# Patient Record
Sex: Female | Born: 1985 | Race: White | Hispanic: No | Marital: Single | Smoking: Former smoker
Health system: Southern US, Community
[De-identification: ages and names within clinical notes are randomized; demographics above are authoritative.]

## PROBLEM LIST (undated history)

## (undated) ENCOUNTER — Inpatient Hospital Stay: Admission: EM | Payer: Self-pay | Source: Home / Self Care

## (undated) DIAGNOSIS — E559 Vitamin D deficiency, unspecified: Secondary | ICD-10-CM

## (undated) DIAGNOSIS — R112 Nausea with vomiting, unspecified: Secondary | ICD-10-CM

## (undated) DIAGNOSIS — G4730 Sleep apnea, unspecified: Secondary | ICD-10-CM

## (undated) DIAGNOSIS — Z9889 Other specified postprocedural states: Secondary | ICD-10-CM

## (undated) DIAGNOSIS — G8929 Other chronic pain: Secondary | ICD-10-CM

## (undated) DIAGNOSIS — F32A Depression, unspecified: Secondary | ICD-10-CM

## (undated) DIAGNOSIS — Z6841 Body Mass Index (BMI) 40.0 and over, adult: Secondary | ICD-10-CM

## (undated) DIAGNOSIS — K219 Gastro-esophageal reflux disease without esophagitis: Secondary | ICD-10-CM

## (undated) DIAGNOSIS — I499 Cardiac arrhythmia, unspecified: Secondary | ICD-10-CM

## (undated) DIAGNOSIS — F319 Bipolar disorder, unspecified: Secondary | ICD-10-CM

## (undated) DIAGNOSIS — F419 Anxiety disorder, unspecified: Secondary | ICD-10-CM

## (undated) DIAGNOSIS — I10 Essential (primary) hypertension: Secondary | ICD-10-CM

## (undated) DIAGNOSIS — J45909 Unspecified asthma, uncomplicated: Secondary | ICD-10-CM

## (undated) DIAGNOSIS — E538 Deficiency of other specified B group vitamins: Secondary | ICD-10-CM

## (undated) DIAGNOSIS — G43909 Migraine, unspecified, not intractable, without status migrainosus: Secondary | ICD-10-CM

## (undated) HISTORY — PX: MOUTH SURGERY: SHX715

## (undated) HISTORY — PX: WISDOM TOOTH EXTRACTION: SHX21

## (undated) HISTORY — PX: CHOLECYSTECTOMY: SHX55

## (undated) HISTORY — PX: OTHER SURGICAL HISTORY: SHX169

---

## 2009-02-06 ENCOUNTER — Emergency Department: Payer: Self-pay | Admitting: Emergency Medicine

## 2009-02-12 HISTORY — PX: CHOLECYSTECTOMY: SHX55

## 2012-02-13 HISTORY — PX: OTHER SURGICAL HISTORY: SHX169

## 2012-10-23 ENCOUNTER — Ambulatory Visit: Payer: Self-pay | Admitting: Obstetrics and Gynecology

## 2012-10-23 LAB — BASIC METABOLIC PANEL
BUN: 11 mg/dL (ref 7–18)
Calcium, Total: 8.9 mg/dL (ref 8.5–10.1)
Chloride: 107 mmol/L (ref 98–107)
Co2: 24 mmol/L (ref 21–32)
Creatinine: 0.73 mg/dL (ref 0.60–1.30)
EGFR (African American): >60
Glucose: 81 mg/dL (ref 65–99)
Potassium: 4.3 mmol/L (ref 3.5–5.1)

## 2012-10-23 LAB — CBC
HCT: 38 % (ref 35.0–47.0)
HGB: 13 g/dL (ref 12.0–16.0)
MCH: 29.8 pg (ref 26.0–34.0)
MCV: 87 fL (ref 80–100)
Platelet: 230 10*3/uL (ref 150–440)
RBC: 4.37 10*6/uL (ref 3.80–5.20)

## 2012-10-28 ENCOUNTER — Ambulatory Visit: Payer: Self-pay | Admitting: Obstetrics and Gynecology

## 2012-11-28 ENCOUNTER — Emergency Department: Payer: Self-pay | Admitting: Emergency Medicine

## 2013-02-27 ENCOUNTER — Emergency Department: Payer: Self-pay | Admitting: Emergency Medicine

## 2013-02-27 LAB — CBC
HCT: 35.4 % (ref 35.0–47.0)
HGB: 12.3 g/dL (ref 12.0–16.0)
MCH: 30.8 pg (ref 26.0–34.0)
MCHC: 34.8 g/dL (ref 32.0–36.0)
MCV: 88 fL (ref 80–100)
PLATELETS: 202 10*3/uL (ref 150–440)
RBC: 4.01 10*6/uL (ref 3.80–5.20)
RDW: 14 % (ref 11.5–14.5)
WBC: 9.5 10*3/uL (ref 3.6–11.0)

## 2013-02-27 LAB — URINALYSIS, COMPLETE
Bilirubin,UR: NEGATIVE
Glucose,UR: NEGATIVE mg/dL (ref 0–75)
KETONE: NEGATIVE
Nitrite: NEGATIVE
Ph: 6 (ref 4.5–8.0)
Protein: NEGATIVE
RBC,UR: 15 /HPF (ref 0–5)
Specific Gravity: 1.005 (ref 1.003–1.030)
Squamous Epithelial: 1
WBC UR: 2 /HPF (ref 0–5)

## 2013-02-27 LAB — HCG, QUANTITATIVE, PREGNANCY: Beta Hcg, Quant.: 76698 m[IU]/mL — ABNORMAL HIGH

## 2013-03-26 ENCOUNTER — Encounter: Payer: Self-pay | Admitting: Maternal & Fetal Medicine

## 2013-05-07 ENCOUNTER — Encounter: Payer: Self-pay | Admitting: Maternal & Fetal Medicine

## 2013-05-21 ENCOUNTER — Observation Stay: Payer: Self-pay

## 2013-05-21 LAB — URINALYSIS, COMPLETE
BILIRUBIN, UR: NEGATIVE
Bacteria: NONE SEEN
Blood: NEGATIVE
Glucose,UR: NEGATIVE mg/dL (ref 0–75)
KETONE: NEGATIVE
Leukocyte Esterase: NEGATIVE
NITRITE: NEGATIVE
PROTEIN: NEGATIVE
Ph: 5 (ref 4.5–8.0)
RBC,UR: NONE SEEN /HPF (ref 0–5)
SPECIFIC GRAVITY: 1.013 (ref 1.003–1.030)
WBC UR: <1 /HPF (ref 0–5)

## 2013-06-23 ENCOUNTER — Observation Stay: Payer: Self-pay | Admitting: Obstetrics & Gynecology

## 2013-06-23 LAB — URINALYSIS, COMPLETE
Bilirubin,UR: NEGATIVE
Blood: NEGATIVE
GLUCOSE, UR: NEGATIVE mg/dL (ref 0–75)
Ketone: NEGATIVE
LEUKOCYTE ESTERASE: NEGATIVE
NITRITE: NEGATIVE
PROTEIN: NEGATIVE
Ph: 7 (ref 4.5–8.0)
RBC,UR: <1 /HPF (ref 0–5)
Specific Gravity: 1.006 (ref 1.003–1.030)
Squamous Epithelial: 2
WBC UR: NONE SEEN /HPF (ref 0–5)

## 2013-06-23 LAB — FETAL FIBRONECTIN
Appearance: NORMAL
Fetal Fibronectin: NEGATIVE

## 2013-07-02 ENCOUNTER — Encounter: Payer: Self-pay | Admitting: Maternal & Fetal Medicine

## 2013-08-13 ENCOUNTER — Encounter: Payer: Self-pay | Admitting: Obstetrics and Gynecology

## 2013-08-20 DIAGNOSIS — F3181 Bipolar II disorder: Secondary | ICD-10-CM | POA: Insufficient documentation

## 2013-08-21 ENCOUNTER — Observation Stay: Payer: Self-pay | Admitting: Obstetrics & Gynecology

## 2013-09-10 ENCOUNTER — Encounter: Payer: Self-pay | Admitting: Obstetrics & Gynecology

## 2013-10-02 ENCOUNTER — Inpatient Hospital Stay: Payer: Self-pay

## 2013-10-02 LAB — PIH PROFILE
Anion Gap: 12 (ref 7–16)
BUN: 4 mg/dL — AB (ref 7–18)
CHLORIDE: 108 mmol/L — AB (ref 98–107)
Calcium, Total: 8.8 mg/dL (ref 8.5–10.1)
Co2: 22 mmol/L (ref 21–32)
Creatinine: 0.63 mg/dL (ref 0.60–1.30)
EGFR (African American): >60
EGFR (Non-African Amer.): >60
GLUCOSE: 93 mg/dL (ref 65–99)
HCT: 32.4 % — ABNORMAL LOW (ref 35.0–47.0)
HGB: 10.8 g/dL — ABNORMAL LOW (ref 12.0–16.0)
MCH: 29.9 pg (ref 26.0–34.0)
MCHC: 33.3 g/dL (ref 32.0–36.0)
MCV: 90 fL (ref 80–100)
OSMOLALITY: 280 (ref 275–301)
PLATELETS: 169 10*3/uL (ref 150–440)
POTASSIUM: 3.6 mmol/L (ref 3.5–5.1)
RBC: 3.61 10*6/uL — ABNORMAL LOW (ref 3.80–5.20)
RDW: 15.2 % — ABNORMAL HIGH (ref 11.5–14.5)
SGOT(AST): 10 U/L — ABNORMAL LOW (ref 15–37)
SODIUM: 142 mmol/L (ref 136–145)
Uric Acid: 4.6 mg/dL (ref 2.6–6.0)
WBC: 10.4 10*3/uL (ref 3.6–11.0)

## 2013-10-02 LAB — PROTEIN / CREATININE RATIO, URINE
Creatinine, Urine: 115.2 mg/dL (ref 30.0–125.0)
PROTEIN/CREAT. RATIO: 174 mg/g{creat} (ref 0–200)
Protein, Random Urine: 20 mg/dL — ABNORMAL HIGH (ref 0–12)

## 2013-10-04 LAB — CBC WITH DIFFERENTIAL/PLATELET
BASOS ABS: 0 10*3/uL (ref 0.0–0.1)
BASOS PCT: 0.3 %
Eosinophil #: 0.1 10*3/uL (ref 0.0–0.7)
Eosinophil %: 1 %
HCT: 32.3 % — ABNORMAL LOW (ref 35.0–47.0)
HGB: 11.1 g/dL — AB (ref 12.0–16.0)
Lymphocyte #: 2.1 10*3/uL (ref 1.0–3.6)
Lymphocyte %: 24.4 %
MCH: 30.8 pg (ref 26.0–34.0)
MCHC: 34.4 g/dL (ref 32.0–36.0)
MCV: 89 fL (ref 80–100)
MONO ABS: 0.5 x10 3/mm (ref 0.2–0.9)
Monocyte %: 5.5 %
NEUTROS PCT: 68.8 %
Neutrophil #: 6 10*3/uL (ref 1.4–6.5)
Platelet: 165 10*3/uL (ref 150–440)
RBC: 3.61 10*6/uL — AB (ref 3.80–5.20)
RDW: 15.5 % — ABNORMAL HIGH (ref 11.5–14.5)
WBC: 8.7 10*3/uL (ref 3.6–11.0)

## 2014-06-04 NOTE — Op Note (Signed)
PATIENT NAME:  Melinda Mendoza, Melinda Mendoza MR#:  [id number] DATE OF BIRTH:  01-03-1986  DATE OF PROCEDURE:  10/28/2012  PREOPERATIVE DIAGNOSIS: Retained intrauterine device.   POSTOPERATIVE DIAGNOSIS: Retained intrauterine device.   PROCEDURES: 1.  Exam under anesthesia.  2.  Removal of intrauterine device.  SURGEON: Thomasene MohairStephen Jackson, MD   ANESTHESIA: General.   ESTIMATED BLOOD LOSS: Minimal.   OPERATIVE FLUIDS: 300 mL crystalloid.   COMPLICATIONS: None.   FINDINGS: 1.  Normal pelvic exam.  2.  Complete and intact removal of intrauterine device.   SPECIMENS: None.   CONDITION AT END OF PROCEDURE: Stable.   INDICATIONS FOR PROCEDURE: Marchelle Folksmanda is a 29 year old female who had an in-office attempted removal of her intrauterine device that was unsuccessful. [facility] where Dr. Luella Cookosenow attempted and in-office removal where the string broke from the IUD. She was therefore referred to me for removal under anesthesia.   PROCEDURE IN DETAIL: The patient was taken to the operating room where general anesthesia was administered and found to be adequate. She was positioned in the dorsal supine, high lithotomy position and prepped and draped in the usual sterile fashion. After timeout was called, a red rubber catheter was introduced into her bladder for straight catheterization of approximately 100 mL of clear urine. A sterile speculum was placed in the vagina, and the anterior lip of the cervix was grasped with a single-tooth tenaculum. Packing forceps was introduced gently through the cervix, and on the third pass the IUD was removed from the lower uterine segment intact. This terminated the procedure. The single-tooth tenaculum was removed from the anterior lip of the cervix and hemostasis was obtained using silver nitrate. The speculum was then removed from the vagina and the vagina was ensured to have no instrumentation or sponges remaining.   The patient tolerated the procedure well.  Sponge, lap and needle counts were correct x 2. The patient was awakened in the operating room and taken to the recovery area in stable condition.  ____________________________ Conard NovakStephen D. Jackson, MD sdj:sb D: 10/28/2012 10:17:20 ET T: 10/28/2012 10:43:51 ET JOB#: [id number]  cc: Conard NovakStephen D. Jackson, MD, <Dictator> Conard NovakSTEPHEN D JACKSON MD ELECTRONICALLY SIGNED 12/02/2012 15:07

## 2014-06-22 NOTE — H&P (Signed)
L&D Evaluation:  History:  HPI 29 yo G2P1001 a [redacted]w[redacted]d at 325w1dy 8wk ultrasound presents today after routine prental visit at [facility] noted BP with elevated systolic into the 14470'Rno proteinuria.  Denies headaches, vision changes, RUQ or epigastric pain, no increased edema, no LOF, no VB, no ctx, +FM.   PNC at [facility] remarkable for scoliosis, obesity, a UTI in January, a hx of bipolar/depression, and first trimester bleeding.  Past hx of pyelo. No hx of kidney stones. B POS, RNI, VNI. SVD 2010 at term   Presents with abdominal pain, back pain, nausea/vomiting   Patient's Medical History 1) heart burn, 2) history of depression/bipolar disorder   Patient's Surgical History cholecystectomy 2010   Medications Pre Natal Vitamins  ferrous sulfate 32574mab po bid, lamictal 100m39mb po daily   Allergies NKDA   Social History none  Former smoker   Family History Non-Contributory   Exam:  Vital Signs BP >140/90  135/63; 141; 75; 135/72; 140/74; 130/72; 134/7; 130/67   Urine Protein P/C ratio pending   General no apparent distress   Mental Status clear   Chest clear   Heart normal sinus rhythm   Abdomen gravid, non-tender   Estimated Fetal Weight Average for gestational age   Fetal Position vtx   Back no CVAT   Edema trace BLE edema   Pelvic 1cm in clinic today   Mebranes Intact   FHT 130, moderate, positive accels, no decels   Ucx absent   Skin dry, no lesions   Impression:  Impression 28 y18G2P1001 at 39w169w1d elevated BP's   Plan:  Comments 1) Elevated BP - given at term >39 weeks discussed pros and cons of delivery vs expectant mangement.  I have no concerns regarding status of the fetus currently or should we deliver at this gestational rage.  With expectant mangement patient risk worening of blood pressures, possible fetal compromise and after discussion of recommendation laid forth by ACOG taskforce on hypertension in pregnancy - PIH panel without  abnormalities P/C ratio 174 (prior 09/17/13 101) - cervidil tonight - no antihypertensive or magnesium sulfate unless patient develops severe range BP's  2) Fetus - category I tracing/reactive - 1-hr 125 (early), 133 repeat at 28 weeks - 19lbs weight gain this pregnancy - tested to 6lbs 13oz with G1  3) PNL B pos / ABsC neg / RNI / VZNI / negative 1st trimester screen / HIV neg / RPR NR / HBsAg neg / 1-hr 125 (early) 133 on repeat / GBS positive - Ampicillin for GBS ppx - Varivax & MMR at discharge  4) Anemia - Hgb 10.1 at 28 weeks, 10.8 today  5) Bipolar/depression - increased risk of postpartum depression (including postpartum depression with G1) - continue lamictal (started 7/19 pre [facility] records)   6) TDAP - received 07/13/13  7) Disposition - pending delivery   Electronic Signatures: StaebDorthula Nettles  (Signed 21-Aug-15 19:55)  Authored: L&D Evaluation   Last Updated: 21-Aug-15 19:55 by StaebDorthula Nettles

## 2014-06-22 NOTE — H&P (Signed)
L&D Evaluation:  History Expanded:  HPI 29 yo G2P1001 at 8336w0d by 7wk ultrasound.  Presents with new-onset RUQ pain since this AM.  Described as sharp, stabbing. Does not radiate.  no nausea, emesis, fevers, chills. not affected by eating.  No diarrhea, mild constipation but baseline for her.  Notes positive fetal movement, no leakage of fluid , no vaginal bleeding, no contractions.  Notes occasional urinary leakage but no urinary symptoms.  No vaginal symptoms.  She has had heartburn.  She has had her gallbladder removed.   Patient's Medical History 1) heart burn, 2) history of depression   Patient's Surgical History cholecystectomy   Medications none   Allergies NKDA   Social History none   Family History Non-Contributory   Exam:  Vital Signs afebrile, normotensive, all others within normal limits   General no apparent distress   Mental Status clear   Chest clear   Heart normal sinus rhythm   Abdomen mildly tender to palpation in RUQ, no rebound or guarding.  No uterine tenerness, uterus is soft.   Fundal Height at umbilicus   Back no CVAT   Edema no edema   FHT normal given gestational age   Ucx absent   Skin no lesions   Impression:  Impression 1) Intrauterine pregnancy at 20 weeks, 2) RUQ pain likely either MSK or GERD related   Plan:  Plan UA   Comments RUQ pain. reassured patient.  Will give rx for flexeril for MSK issue.  Recommend zantac for gerd.  return to clinic next week if not improved. return to hospital if worsens.   Electronic Signatures: Conard NovakJackson, Loic Hobin D (MD)  (Signed 09-Apr-15 14:36)  Authored: L&D Evaluation   Last Updated: 09-Apr-15 14:36 by Conard NovakJackson, Jerica Creegan D (MD)

## 2014-06-22 NOTE — H&P (Signed)
L&D Evaluation:  History Expanded:  HPI 29 yo G2P1 at 33 weeks w contractions and lower abd pain, good FM and no vaginal bleeding or ROM.  Prenatal Care at [facility].   Presents with abdominal pain, contractions   Patient's Medical History No Chronic Illness   Patient's Surgical History none   Medications Pre Natal Vitamins   Allergies NKDA   Social History none   Family History Non-Contributory   ROS:  ROS All systems were reviewed.  HEENT, CNS, GI, GU, Respiratory, CV, Renal and Musculoskeletal systems were found to be normal.   Exam:  Vital Signs stable   General no apparent distress   Mental Status clear   Chest clear   Heart normal sinus rhythm   Abdomen gravid, tender with contractions   Estimated Fetal Weight Average for gestational age   Back no CVAT   Edema no edema   Pelvic no external lesions, cervix closed and thick   Mebranes Intact   FHT normal rate with no decels   Ucx irregular   Impression:  Impression False labor, Pain.   Plan:  Plan EFM/NST, monitor contractions and for cervical change, fluids   Comments Note for work   Electronic Signatures: Letitia LibraHarris, Makala Fetterolf Paul (MD)  (Signed 10-Jul-15 14:35)  Authored: L&D Evaluation   Last Updated: 10-Jul-15 14:35 by Letitia LibraHarris, Lendell Gallick Paul (MD)

## 2014-06-22 NOTE — H&P (Signed)
L&D Evaluation:  History:  HPI 29 yo G2P1001 at 557w5d by 8wk ultrasound.  Presents with c/o constant lower back pain, nausea/vomiting (vomited once yesterday), loose stool x1, and lower abdominal cramping since yesterday AM. Lower back pain in the sacral area radiating to the hips. Denies fever, VB, unusual vaginal discharge, dysuria. PNC at [facility] remarkable for scoliosis, obesity, a UTI in January, a hx of bipolar/depression, and first trimester bleeding.  Past hx of pyelo. No hx of kidney stones. B POS, RNI, VNI. SVD 2010 at term   Presents with abdominal pain, back pain, nausea/vomiting   Patient's Medical History 1) heart burn, 2) history of depression/bipolar disorder   Patient's Surgical History cholecystectomy 2010   Medications Pre Natal Vitamins   Allergies NKDA   Social History none  Former smoker   Family History Non-Contributory   ROS:  ROS see HPI   Exam:  Vital Signs 116/70   Urine Protein pending   General no apparent distress   Mental Status clear   Chest clear   Heart normal sinus rhythm, no murmur/gallop/rubs   Abdomen FH at U+3. softNDNT upper abdomen with normal BS   Back no CVAT, tenderness in SI jts   Pelvic no external lesions, cervix closed and thick, FFN obtained   Mebranes Intact   FHT 135   Ucx absent   Impression:  Impression IUP at 24+ weeks with lower abdominal cramping and sacral back pain. R/O UTI. No evidence of PTL   Plan:  Plan EFM/NST, monitor contractions and for cervical change, UA sent. Zofran 8 mgm po for nausea. po hydration   Electronic Signatures: Trinna BalloonGutierrez, Donta Fuster L (CNM)  (Signed 12-May-15 08:20)  Authored: L&D Evaluation   Last Updated: 12-May-15 08:20 by Trinna BalloonGutierrez, Kaiyon Hynes L (CNM)

## 2014-11-08 ENCOUNTER — Ambulatory Visit
Admission: EM | Admit: 2014-11-08 | Discharge: 2014-11-08 | Disposition: A | Discharge status: Home / Self Care | Payer: Medicaid Other | Attending: Family Medicine | Admitting: Family Medicine

## 2014-11-08 DIAGNOSIS — H6982 Other specified disorders of Eustachian tube, left ear: Secondary | ICD-10-CM | POA: Diagnosis not present

## 2014-11-08 DIAGNOSIS — H6502 Acute serous otitis media, left ear: Secondary | ICD-10-CM

## 2014-11-08 DIAGNOSIS — H9202 Otalgia, left ear: Secondary | ICD-10-CM

## 2014-11-08 MED ORDER — FEXOFENADINE-PSEUDOEPHED ER 180-240 MG PO TB24: 1.0000 | ORAL_TABLET | Freq: Every day | ORAL | Status: DC

## 2014-11-08 MED ORDER — AMOXICILLIN-POT CLAVULANATE 875-125 MG PO TABS
1.0000 | ORAL_TABLET | Freq: Two times a day (BID) | ORAL | Status: DC
Start: 2014-11-08 — End: 2017-05-09

## 2014-11-08 MED ORDER — MOMETASONE FUROATE 50 MCG/ACT NA SUSP: 2.0000 | Freq: Every day | NASAL | Status: DC

## 2014-11-08 NOTE — ED Notes (Signed)
Started Saturday with left ear pain that was intermittent. Constant pain since last night. "Hurts deep down behind my ear". States had deep dental cleaning Friday and 3 numbing injections upper back left

## 2014-11-08 NOTE — ED Provider Notes (Signed)
CSN: [id number]     Arrival date & time 11/08/14  0807 History   First MD Initiated Contact with Patient 11/08/14 [phone]     Chief Complaint  Patient presents with  . Otalgia   patient states is going on since Friday. (Consider location/radiation/quality/duration/timing/severity/associated sxs/prior Treatment) Patient is a 29 y.o. female presenting with ear pain. The history is provided by the patient.  Otalgia Location:  Left Behind ear:  Swelling Quality:  Pressure Severity:  Moderate Duration:  3 days Timing:  Constant Progression:  Worsening Chronicity:  New Relieved by:  Nothing Worsened by:  Swallowing Ineffective treatments:  None tried Associated symptoms: congestion, rhinorrhea and sore throat   Associated symptoms: no abdominal pain, no cough, no ear discharge, no fever and no hearing loss    Patient does not smoke. Nausea exposing smokeless around her. Nurse's notes reviewed and evaluated. History reviewed. No pertinent past medical history. History reviewed. No pertinent past surgical history. History reviewed. No pertinent family history. Social History  Substance Use Topics  . Smoking status: None  . Smokeless tobacco: None  . Alcohol Use: None   OB History    No data available     Review of Systems  Constitutional: Negative for fever.  HENT: Positive for congestion, ear pain, rhinorrhea and sore throat. Negative for ear discharge and hearing loss.   Respiratory: Negative for cough.   Gastrointestinal: Negative for abdominal pain.  All other systems reviewed and are negative.   Allergies  Review of patient's allergies indicates no known allergies.  Home Medications   Prior to Admission medications   Medication Sig Start Date End Date Taking? Authorizing Provider  etonogestrel (NEXPLANON) 68 MG IMPL implant 1 each by Subdermal route once.   Yes Historical Provider, MD  amoxicillin-clavulanate (AUGMENTIN) 875-125 MG per tablet Take 1 tablet by mouth 2  (two) times daily. 11/08/14   Hassan Rowan, MD  fexofenadine-pseudoephedrine (ALLEGRA-D ALLERGY & CONGESTION) 180-240 MG per 24 hr tablet Take 1 tablet by mouth daily. 11/08/14   Hassan Rowan, MD  mometasone (NASONEX) 50 MCG/ACT nasal spray Place 2 sprays into the nose daily. 11/08/14   Hassan Rowan, MD   Meds Ordered and Administered this Visit  Medications - No data to display  BP 120/79 mmHg  Pulse 74  Temp(Src) 97.8 F (36.6 C) (Tympanic)  Resp 17  Ht  (1.676 m)  Wt 250 lb (113.399 kg)  BMI 40.37 kg/m2  SpO2 100% No data found.   Physical Exam  Constitutional: She is oriented to person, place, and time. She appears well-developed and well-nourished.  Obese white female  HENT:  Head: Normocephalic and atraumatic.  Right Ear: Hearing, tympanic membrane, external ear and ear canal normal.  Left Ear: External ear normal. Tympanic membrane is bulging. A middle ear effusion is present.  Nose: Mucosal edema and rhinorrhea present. Right sinus exhibits no maxillary sinus tenderness. Left sinus exhibits no maxillary sinus tenderness and no frontal sinus tenderness.  Mouth/Throat: No oral lesions. Normal dentition. No uvula swelling or dental caries. Posterior oropharyngeal erythema present.  Eyes: Conjunctivae and EOM are normal. Pupils are equal, round, and reactive to light.  Neck: Normal range of motion. Neck supple. No tracheal deviation present. No thyromegaly present.  Musculoskeletal: Normal range of motion.  Lymphadenopathy:    She has cervical adenopathy.  Neurological: She is alert and oriented to person, place, and time.  Skin: Skin is warm and dry. No erythema.  Psychiatric: She has a normal mood and affect.  Vitals reviewed.   ED Course  Procedures (including critical care time)  Labs Review Labs Reviewed - No data to display  Imaging Review No results found.   Visual Acuity Review  Right Eye Distance:   Left Eye Distance:   Bilateral Distance:    Right  Eye Near:   Left Eye Near:    Bilateral Near:         MDM   1. Eustachian tube dysfunction, left   2. Acute serous otitis media of left ear, recurrence not specified   3. Otalgia of left ear     Follow-up PCP if not better in a week. We'll place on Allegra-D and Nasonex nasal spray and Augmentin. Explained she needs take Augmentin for least 10 days. Patient declined work note.   Hassan Rowan, MD 11/08/14 [phone]

## 2014-11-08 NOTE — Discharge Instructions (Signed)
Otalgia Otalgia is pain in or around the ear. When the pain is from the ear itself it is called primary otalgia. Pain may also be coming from somewhere else, like the head and neck. This is called secondary otalgia.  CAUSES  Causes of primary otalgia include:  Middle ear infection.  It can also be caused by injury to the ear or infection of the ear canal (swimmer's ear). Swimmer's ear causes pain, swelling and often drainage from the ear canal. Causes of secondary otalgia include:  Sinus infections.  Allergies and colds that cause stuffiness of the nose and tubes that drain the ears (eustachian tubes).  Dental problems like cavities, gum infections or teething.  Sore Throat (tonsillitis and pharyngitis).  Swollen glands in the neck.  Infection of the bone behind the ear (mastoiditis).  TMJ discomfort (problems with the joint between your jaw and your skull).  Other problems such as nerve disorders, circulation problems, heart disease and tumors of the head and neck can also cause symptoms of ear pain. This is rare. DIAGNOSIS  Evaluation, Diagnosis and Testing:  Examination by your medical caregiver is recommended to evaluate and diagnose the cause of otalgia.  Further testing or referral to a specialist may be indicated if the cause of the ear pain is not found and the symptom persists. TREATMENT   Your doctor may prescribe antibiotics if an ear infection is diagnosed.  Pain relievers and topical analgesics may be recommended.  It is important to take all medications as prescribed. HOME CARE INSTRUCTIONS   It may be helpful to sleep with the painful ear in the up position.  A warm compress over the painful ear may provide relief.  A soft diet and avoiding gum may help while ear pain is present. SEEK IMMEDIATE MEDICAL CARE IF:  You develop severe pain, a high fever, repeated vomiting or dehydration.  You develop extreme dizziness, headache, confusion, ringing in the  ears (tinnitus) or hearing loss. Document Released: 03/08/2004 Document Revised: 04/23/2011 Document Reviewed: 12/08/2008 [facility] Patient Information 2015 [place], [place]. This information is not intended to replace advice given to you by your health care provider. Make sure you discuss any questions you have with your health care provider.  Serous Otitis Media Serous otitis media is fluid in the middle ear space. This space contains the bones for hearing and air. Air in the middle ear space helps to transmit sound.  The air gets there through the eustachian tube. This tube goes from the back of the nose (nasopharynx) to the middle ear space. It keeps the pressure in the middle ear the same as the outside world. It also helps to drain fluid from the middle ear space. CAUSES  Serous otitis media occurs when the eustachian tube gets blocked. Blockage can come from:  Ear infections.  Colds and other upper respiratory infections.  Allergies.  Irritants such as cigarette smoke.  Sudden changes in air pressure (such as descending in an airplane).  Enlarged adenoids.  A mass in the nasopharynx. During colds and upper respiratory infections, the middle ear space can become temporarily filled with fluid. This can happen after an ear infection also. Once the infection clears, the fluid will generally drain out of the ear through the eustachian tube. If it does not, then serous otitis media occurs. SIGNS AND SYMPTOMS   Hearing loss.  A feeling of fullness in the ear, without pain.  Young children may not show any symptoms but may show slight behavioral changes, such  as agitation, ear pulling, or crying. DIAGNOSIS  Serous otitis media is diagnosed by an ear exam. Tests may be done to check on the movement of the eardrum. Hearing exams may also be done. TREATMENT  The fluid most often goes away without treatment. If allergy is the cause, allergy treatment may be helpful. Fluid that persists for  several months may require minor surgery. A small tube is placed in the eardrum to:  Drain the fluid.  Restore the air in the middle ear space. In certain situations, antibiotic medicines are used to avoid surgery. Surgery may be done to remove enlarged adenoids (if this is the cause). HOME CARE INSTRUCTIONS   Keep children away from tobacco smoke.  Keep all follow-up visits as directed by your health care provider. SEEK MEDICAL CARE IF:   Your hearing is not better in 3 months.  Your hearing is worse.  You have ear pain.  You have drainage from the ear.  You have dizziness.  You have serous otitis media only in one ear or have any bleeding from your nose (epistaxis).  You notice a lump on your neck. MAKE SURE YOU:  Understand these instructions.   Will watch your condition.   Will get help right away if you are not doing well or get worse.  Document Released: 04/21/2003 Document Revised: 06/15/2013 Document Reviewed: 08/26/2012 [facility] Patient Information 2015 [place], [place]. This information is not intended to replace advice given to you by your health care provider. Make sure you discuss any questions you have with your health care provider. Barotitis Media Barotitis media is inflammation of your middle ear. This occurs when the auditory tube (eustachian tube) leading from the back of your nose (nasopharynx) to your eardrum is blocked. This blockage may result from a cold, environmental allergies, or an upper respiratory infection. Unresolved barotitis media may lead to damage or hearing loss (barotrauma), which may become permanent. HOME CARE INSTRUCTIONS   Use medicines as recommended by your health care provider. Over-the-counter medicines will help unblock the canal and can help during times of air travel.  Do not put anything into your ears to clean or unplug them. Eardrops will not be helpful.  Do not swim, dive, or fly until your health care provider says it  is all right to do so. If these activities are necessary, chewing gum with frequent, forceful swallowing may help. It is also helpful to hold your nose and gently blow to pop your ears for equalizing pressure changes. This forces air into the eustachian tube.  Only take over-the-counter or prescription medicines for pain, discomfort, or fever as directed by your health care provider.  A decongestant may be helpful in decongesting the middle ear and make pressure equalization easier. SEEK MEDICAL CARE IF:  You experience a serious form of dizziness in which you feel as if the room is spinning and you feel nauseated (vertigo).  Your symptoms only involve one ear. SEEK IMMEDIATE MEDICAL CARE IF:   You develop a severe headache, dizziness, or severe ear pain.  You have bloody or pus-like drainage from your ears.  You develop a fever.  Your problems do not improve or become worse. MAKE SURE YOU:   Understand these instructions.  Will watch your condition.  Will get help right away if you are not doing well or get worse. Document Released: 01/27/2000 Document Revised: 11/19/2012 Document Reviewed: 08/26/2012 [facility] Patient Information 2015 [place], [place]. This information is not intended to replace advice given to you  by your health care provider. Make sure you discuss any questions you have with your health care provider. ° °

## 2014-12-07 ENCOUNTER — Ambulatory Visit
Admission: EM | Admit: 2014-12-07 | Discharge: 2014-12-07 | Disposition: A | Discharge status: Home / Self Care | Payer: Medicaid Other | Attending: Family Medicine | Admitting: Family Medicine

## 2014-12-07 ENCOUNTER — Encounter: Payer: Self-pay | Admitting: Emergency Medicine

## 2014-12-07 ENCOUNTER — Ambulatory Visit: Payer: Medicaid Other

## 2014-12-07 DIAGNOSIS — S39012A Strain of muscle, fascia and tendon of lower back, initial encounter: Secondary | ICD-10-CM | POA: Diagnosis not present

## 2014-12-07 DIAGNOSIS — S4992XA Unspecified injury of left shoulder and upper arm, initial encounter: Secondary | ICD-10-CM | POA: Diagnosis not present

## 2014-12-07 DIAGNOSIS — M545 Low back pain: Secondary | ICD-10-CM | POA: Diagnosis present

## 2014-12-07 DIAGNOSIS — S29012A Strain of muscle and tendon of back wall of thorax, initial encounter: Secondary | ICD-10-CM

## 2014-12-07 MED ORDER — CYCLOBENZAPRINE HCL 10 MG PO TABS: 10.0000 mg | ORAL_TABLET | Freq: Every day | ORAL | Status: DC

## 2014-12-07 MED ORDER — IBUPROFEN 800 MG PO TABS
800.0000 mg | ORAL_TABLET | Freq: Once | ORAL | Status: AC
  Administered 2014-12-07: 800 mg via ORAL
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
## 2015-04-29 ENCOUNTER — Other Ambulatory Visit: Payer: Self-pay | Admitting: Orthopedic Surgery

## 2015-04-29 DIAGNOSIS — M5136 Other intervertebral disc degeneration, lumbar region: Secondary | ICD-10-CM

## 2015-05-20 ENCOUNTER — Ambulatory Visit
Admission: RE | Admit: 2015-05-20 | Discharge: 2015-05-20 | Disposition: A | Discharge status: Home / Self Care | Payer: Medicaid Other | Source: Ambulatory Visit | Attending: Orthopedic Surgery | Admitting: Orthopedic Surgery

## 2015-05-20 DIAGNOSIS — M5136 Other intervertebral disc degeneration, lumbar region: Secondary | ICD-10-CM | POA: Insufficient documentation

## 2015-06-07 DIAGNOSIS — F3132 Bipolar disorder, current episode depressed, moderate: Secondary | ICD-10-CM | POA: Insufficient documentation

## 2015-09-20 ENCOUNTER — Emergency Department: Payer: Medicaid Other

## 2015-09-20 ENCOUNTER — Emergency Department
Admission: EM | Admit: 2015-09-20 | Discharge: 2015-09-20 | Disposition: A | Discharge status: Home / Self Care | Payer: Medicaid Other | Attending: Emergency Medicine | Admitting: Emergency Medicine

## 2015-09-20 DIAGNOSIS — T148XXA Other injury of unspecified body region, initial encounter: Secondary | ICD-10-CM

## 2015-09-20 DIAGNOSIS — W108XXA Fall (on) (from) other stairs and steps, initial encounter: Secondary | ICD-10-CM | POA: Insufficient documentation

## 2015-09-20 DIAGNOSIS — Y939 Activity, unspecified: Secondary | ICD-10-CM | POA: Insufficient documentation

## 2015-09-20 DIAGNOSIS — Z79899 Other long term (current) drug therapy: Secondary | ICD-10-CM | POA: Diagnosis not present

## 2015-09-20 DIAGNOSIS — M25511 Pain in right shoulder: Secondary | ICD-10-CM | POA: Insufficient documentation

## 2015-09-20 DIAGNOSIS — M5417 Radiculopathy, lumbosacral region: Secondary | ICD-10-CM

## 2015-09-20 DIAGNOSIS — M5416 Radiculopathy, lumbar region: Secondary | ICD-10-CM | POA: Diagnosis not present

## 2015-09-20 DIAGNOSIS — G8911 Acute pain due to trauma: Secondary | ICD-10-CM

## 2015-09-20 DIAGNOSIS — S8991XA Unspecified injury of right lower leg, initial encounter: Secondary | ICD-10-CM | POA: Diagnosis present

## 2015-09-20 DIAGNOSIS — S86911A Strain of unspecified muscle(s) and tendon(s) at lower leg level, right leg, initial encounter: Secondary | ICD-10-CM | POA: Insufficient documentation

## 2015-09-20 DIAGNOSIS — Y929 Unspecified place or not applicable: Secondary | ICD-10-CM | POA: Diagnosis not present

## 2015-09-20 DIAGNOSIS — Y999 Unspecified external cause status: Secondary | ICD-10-CM | POA: Insufficient documentation

## 2015-09-20 LAB — POCT PREGNANCY, URINE: PREG TEST UR: NEGATIVE

## 2015-09-20 MED ORDER — PREDNISONE 10 MG (21) PO TBPK: ORAL_TABLET | ORAL | 0 refills | Status: DC

## 2015-09-20 MED ORDER — BACLOFEN 10 MG PO TABS: 10.0000 mg | ORAL_TABLET | Freq: Three times a day (TID) | ORAL | 0 refills | Status: DC

## 2015-09-20 NOTE — Discharge Instructions (Signed)
Call to schedule an appointment with Dr. Sherryll BurgerShah. Return to the ER for symptoms that change or worsen or for new concerns if you are unable to see the specialist or your primary care provider. Stop the tizanidine while taking the baclofen.
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
## 2015-10-19 ENCOUNTER — Emergency Department: Payer: Medicaid Other

## 2015-10-19 ENCOUNTER — Ambulatory Visit: Payer: Medicaid Other

## 2015-10-19 ENCOUNTER — Emergency Department
Admission: EM | Admit: 2015-10-19 | Discharge: 2015-10-19 | Disposition: A | Discharge status: Home / Self Care | Payer: Medicaid Other | Attending: Emergency Medicine | Admitting: Emergency Medicine

## 2015-10-19 ENCOUNTER — Encounter: Payer: Self-pay | Admitting: *Deleted

## 2015-10-19 DIAGNOSIS — Y929 Unspecified place or not applicable: Secondary | ICD-10-CM | POA: Diagnosis not present

## 2015-10-19 DIAGNOSIS — Z792 Long term (current) use of antibiotics: Secondary | ICD-10-CM | POA: Insufficient documentation

## 2015-10-19 DIAGNOSIS — W010XXA Fall on same level from slipping, tripping and stumbling without subsequent striking against object, initial encounter: Secondary | ICD-10-CM | POA: Insufficient documentation

## 2015-10-19 DIAGNOSIS — Y939 Activity, unspecified: Secondary | ICD-10-CM | POA: Insufficient documentation

## 2015-10-19 DIAGNOSIS — S43004A Unspecified dislocation of right shoulder joint, initial encounter: Secondary | ICD-10-CM

## 2015-10-19 DIAGNOSIS — S43014A Anterior dislocation of right humerus, initial encounter: Secondary | ICD-10-CM | POA: Insufficient documentation

## 2015-10-19 DIAGNOSIS — Z79899 Other long term (current) drug therapy: Secondary | ICD-10-CM | POA: Diagnosis not present

## 2015-10-19 DIAGNOSIS — Y999 Unspecified external cause status: Secondary | ICD-10-CM | POA: Diagnosis not present

## 2015-10-19 DIAGNOSIS — S4991XA Unspecified injury of right shoulder and upper arm, initial encounter: Secondary | ICD-10-CM | POA: Diagnosis present

## 2015-10-19 MED ORDER — OXYCODONE-ACETAMINOPHEN 5-325 MG PO TABS: 2.0000 | ORAL_TABLET | Freq: Four times a day (QID) | ORAL | 0 refills | Status: DC | PRN

## 2015-10-19 MED ORDER — HYDROMORPHONE HCL 1 MG/ML IJ SOLN
1.0000 mg | Freq: Once | INTRAMUSCULAR | Status: AC
  Administered 2015-10-19: 1 mg via INTRAMUSCULAR
  Filled 2015-10-19: qty 1

## 2015-10-19 MED ORDER — DIAZEPAM 5 MG PO TABS
10.0000 mg | ORAL_TABLET | Freq: Once | ORAL | Status: AC
  Administered 2015-10-19: 10 mg via ORAL
  Filled 2015-10-19: qty 2

## 2015-10-19 NOTE — ED Notes (Signed)
bp is elevated.  Pt says it has been like that lately, and agrees to follow up with pcp [facility].  She will call for appt.

## 2015-10-19 NOTE — ED Triage Notes (Signed)
Pt tripped over dog fell down stairs injured right shoulder, pt denies hitting head or LOC, right shoulder appears lower than left

## 2015-10-19 NOTE — ED Provider Notes (Signed)
[facility] Emergency Department Provider Note        Time seen: ----------------------------------------- 10:16 AM on 10/19/2015 -----------------------------------------    I have reviewed the triage vital signs and the nursing notes.   HISTORY  Chief Complaint Shoulder Injury    HPI Melinda Mendoza is a 30 y.o. female who presents to ER after a fall today. Patient states she tripped over her dog and fell down some stairs injuring her right shoulder. She is concerned she may have dislocated her right shoulder. She doesn't history of falls and is being seen by neurology for same. She denies any other injuries or complaints other than the right shoulder pain. Any movement causes severe pain.   History reviewed. No pertinent past medical history.  There are no active problems to display for this patient.   Past Surgical History:  Procedure Laterality Date  . CHOLECYSTECTOMY    . laproscopy for IUD removal      Allergies Review of patient's allergies indicates no known allergies.  Social History Social History  Substance Use Topics  . Smoking status: Never Smoker  . Smokeless tobacco: Never Used  . Alcohol use No    Review of Systems Constitutional: Negative for fever. Cardiovascular: Negative for chest pain. Respiratory: Negative for shortness of breath. Gastrointestinal: Negative for abdominal pain, vomiting and diarrhea. Musculoskeletal: Positive for right shoulder pain Skin: Negative for rash. Neurological: Negative for headaches, focal weakness or numbness. ____________________________________________   PHYSICAL EXAM:  VITAL SIGNS: ED Triage Vitals [10/19/15 1010]  Enc Vitals Group     BP      Pulse      Resp      Temp      Temp src      SpO2      Weight 270 lb (122.5 kg)     Height 5\' 4"  (1.626 m)     Head Circumference      Peak Flow      Pain Score 10     Pain Loc      Pain Edu?      Excl. in GC?      Constitutional: Alert and oriented. Mild distress from pain Eyes: Conjunctivae are normal. Normal extraocular movements. Musculoskeletal: Right shoulder is tender to touch, no deformities are noted. Pain with range of motion of the right shoulder. Neurologic:  Normal speech and language. No gross focal neurologic deficits are appreciated.  Skin:  Skin is warm, dry and intact. No rash noted. Psychiatric: Mood and affect are normal. Speech and behavior are normal.  ___________________________________________  ED COURSE:  Pertinent labs & imaging results that were available during my care of the patient were reviewed by me and considered in my medical decision making (see chart for details). Clinical Course  Patient presents to ER after mechanical fall. We will assess with imaging. She will receive IM pain medication.  Reduction of dislocation Date/Time: 10/19/2015 12:00 PM Performed by: Emily FilbertWILLIAMS, Dayleen Beske E Authorized by: Daryel NovemberWILLIAMS, Terran Klinke E  Consent: The procedure was performed in an emergent situation. Verbal consent obtained. Written consent not obtained. Risks and benefits: risks, benefits and alternatives were discussed Consent given by: patient Patient understanding: patient states understanding of the procedure being performed Patient consent: the patient's understanding of the procedure matches consent given Procedure consent: procedure consent matches procedure scheduled Relevant documents: relevant documents present and verified Test results: test results available and properly labeled Site marked: the operative site was marked Imaging studies: imaging studies available Patient identity confirmed:  verbally with patient Time out: Immediately prior to procedure a "time out" was called to verify the correct patient, procedure, equipment, support staff and site/side marked as required. Local anesthesia used: no  Anesthesia: Local anesthesia used: no  Sedation: Patient  sedated: no Comments: Right shoulder reduction was performed using gentle manipulation. Patient had received IM Dilaudid and oral Valium prior to right shoulder reduction. Right shoulder was adducted to 90 and externally rotated with subsequent shoulder reduction.    ____________________________________________   RADIOLOGY Images were viewed by me  Right shoulder x-rays IMPRESSION: 1. Relocation of humeral head. 2. Suspicion of Hill-Sachs deformity in the posterior humeral head. These results will be called to the ordering clinician or representative by the Radiologist Assistant, and communication documented in the PACS or zVision Dashboard.  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 Patient was placed in a shoulder immobilizer. She'll be referred to orthopedics for close outpatient follow-up.   Emily Filbert, MD   Note: This dictation was prepared with Dragon dictation. Any transcriptional errors that result from this process are unintentional    Emily Filbert, MD 10/19/15 1323

## 2015-10-25 ENCOUNTER — Ambulatory Visit: Payer: Medicaid Other | Admitting: Physical Therapy

## 2015-10-27 ENCOUNTER — Ambulatory Visit: Payer: Medicaid Other | Admitting: Physical Therapy

## 2015-11-01 ENCOUNTER — Ambulatory Visit: Payer: Medicaid Other | Admitting: Physical Therapy

## 2015-11-02 DIAGNOSIS — S43014A Anterior dislocation of right humerus, initial encounter: Secondary | ICD-10-CM | POA: Insufficient documentation

## 2015-11-03 ENCOUNTER — Ambulatory Visit: Payer: Medicaid Other | Admitting: Physical Therapy

## 2015-11-08 ENCOUNTER — Ambulatory Visit: Payer: Medicaid Other | Admitting: Physical Therapy

## 2015-11-10 ENCOUNTER — Ambulatory Visit: Payer: Medicaid Other | Admitting: Physical Therapy

## 2015-12-13 ENCOUNTER — Encounter: Payer: Self-pay | Admitting: Physical Therapy

## 2015-12-13 ENCOUNTER — Ambulatory Visit: Payer: Medicaid Other | Attending: Surgery | Admitting: Physical Therapy

## 2015-12-13 DIAGNOSIS — G8929 Other chronic pain: Secondary | ICD-10-CM | POA: Diagnosis present

## 2015-12-13 DIAGNOSIS — M5441 Lumbago with sciatica, right side: Secondary | ICD-10-CM | POA: Diagnosis present

## 2015-12-13 DIAGNOSIS — M25511 Pain in right shoulder: Secondary | ICD-10-CM | POA: Insufficient documentation
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
## 2016-01-31 ENCOUNTER — Ambulatory Visit: Payer: Medicaid Other

## 2016-02-01 ENCOUNTER — Encounter: Payer: Self-pay | Admitting: Physical Therapy

## 2016-02-01 ENCOUNTER — Ambulatory Visit: Payer: Medicaid Other | Attending: Surgery | Admitting: Physical Therapy

## 2016-02-01 VITALS — BP 156/84 | HR 87

## 2016-02-01 DIAGNOSIS — M25511 Pain in right shoulder: Secondary | ICD-10-CM

## 2016-02-01 NOTE — Patient Instructions (Addendum)
Levator Scapula Stretch, Sitting    **Do not pull on head as in this picture.  Sit, one hand tucked under hip on side to be stretched. Turn head toward other side and look down. Hold 10 seconds. Repeat 5 times per session. Do 3 sessions per day.  Repeat this same exercise but with your head looking up instead of down. **Remember this should be a gentle stretch.  Do this in a painfree range.   Copyright  VHI. All rights reserved.

## 2016-02-02 NOTE — Addendum Note (Signed)
Addended by: Encarnacion ChuABASHIAN, Kynisha Memon D on: 02/02/2016 12:36 PM   Modules accepted: Orders
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
## 2016-02-09 ENCOUNTER — Ambulatory Visit: Payer: Medicaid Other | Admitting: Physical Therapy

## 2016-02-13 DIAGNOSIS — Z8489 Family history of other specified conditions: Secondary | ICD-10-CM

## 2016-02-13 HISTORY — DX: Family history of other specified conditions: Z84.89

## 2016-02-14 ENCOUNTER — Ambulatory Visit: Payer: Medicaid Other | Attending: Surgery | Admitting: Physical Therapy

## 2016-02-14 ENCOUNTER — Encounter: Payer: Self-pay | Admitting: Physical Therapy

## 2016-02-14 DIAGNOSIS — M25511 Pain in right shoulder: Secondary | ICD-10-CM | POA: Insufficient documentation

## 2016-02-14 DIAGNOSIS — G8929 Other chronic pain: Secondary | ICD-10-CM | POA: Diagnosis present

## 2016-02-14 DIAGNOSIS — M5441 Lumbago with sciatica, right side: Secondary | ICD-10-CM | POA: Insufficient documentation
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
## 2016-02-16 ENCOUNTER — Ambulatory Visit: Payer: Medicaid Other | Admitting: Physical Therapy

## 2016-02-21 ENCOUNTER — Ambulatory Visit: Payer: Medicaid Other | Admitting: Physical Therapy

## 2016-02-23 ENCOUNTER — Encounter: Payer: Self-pay | Admitting: Physical Therapy

## 2016-02-23 ENCOUNTER — Ambulatory Visit: Payer: Medicaid Other | Admitting: Physical Therapy

## 2016-02-23 DIAGNOSIS — M25511 Pain in right shoulder: Secondary | ICD-10-CM | POA: Diagnosis not present

## 2016-02-23 DIAGNOSIS — G8929 Other chronic pain: Secondary | ICD-10-CM

## 2016-02-23 DIAGNOSIS — M5441 Lumbago with sciatica, right side: Secondary | ICD-10-CM

## 2016-02-23 NOTE — Patient Instructions (Addendum)
Scapular Retraction (Standing)    With arms at sides, pinch shoulder blades together. Repeat 5-10____ times per set. Do _1___ sets per session. Do 3+____ sessions per day.  http://orth.exer.us/945   Copyright  VHI. All rights reserved.  External Rotation (Eccentric), Active - Side-Lying    Lie on side, affected arm on top, elbow bent to 90, towel under elbow. Quickly lift forearm of affected arm. Slowly lower affected arm for 3-5 seconds. _10__ reps per set, __2_ sets per day, _5__ days per week. Add _1__ lbs   http://ecce.exer.us/189   Copyright  VHI. All rights reserved.  External Rotation (Eccentric), (Resistance Band)    Quickly pull band outward with forearm of right arm. Keep elbows at sides, wrists neutral. Slowly return to start for 3-5 seconds. Use ___red_____ resistance band. Hint: Use towel roll between elbow and hip. _10__ reps per set, _2__ sets per day, ___ days per week.  http://ecce.exer.us/197   Copyright  VHI. All rights reserved.   CHEST: Doorway, Bilateral - Standing    Standing in doorway, place hands on wall with elbows bent at side (low) (do not lift arms overhead, keep them low on the door frame). Lean forward. Hold __15_ seconds. _3__ reps per set, _2__ sets per day, _5__ days per week  Copyright  VHI. All rights reserved.  Extension (Isometric)    Place left bent elbow and back of arm against wall. Press elbow against wall. Hold __5__ seconds. Repeat _10___ times. Do ____ sessions per day.  http://gt2.exer.us/112   Copyright  VHI. All rights reserved.  Flexion (Isometric)    Press right fist against wall. Hold __5__ seconds. Repeat _10___ times. Do 2____ sessions per day.  http://gt2.exer.us/114   Copyright  VHI. All rights reserved.  Internal Rotation (Isometric)    Place palm of right fist against door frame, with elbow bent. Press fist against door frame. Hold __5__ seconds. Repeat _10___ times. Do __2__ sessions per  day.  http://gt2.exer.us/108   Copyright  VHI. All rights reserved.  SHOULDER: Abduction (Isometric)    Use wall as resistance. Press arm against pillow. Keep elbow bent. Hold _5__ seconds. __10_ reps per set, __2_ sets per day, 5___ days per week  Copyright  VHI. All rights reserved.  Scalene Stretch, Sitting    Sit, right hand tucked under hip on side to be stretched, tilt head to left, look up to right and slightly look up towards ceiling until stretch is felt in side of knee and down side of shoulder.  Hold __5_ seconds. For more stretch, lean body in direction of head pull. Repeat _5__ times per session. Do __2_ sessions per day.  Copyright  VHI. All rights reserved.
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
## 2016-02-28 ENCOUNTER — Ambulatory Visit: Payer: Medicaid Other | Admitting: Physical Therapy

## 2016-02-28 ENCOUNTER — Encounter: Payer: Self-pay | Admitting: Physical Therapy

## 2016-02-28 DIAGNOSIS — M25511 Pain in right shoulder: Secondary | ICD-10-CM

## 2016-02-28 DIAGNOSIS — M5441 Lumbago with sciatica, right side: Secondary | ICD-10-CM

## 2016-02-28 DIAGNOSIS — G8929 Other chronic pain: Secondary | ICD-10-CM
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
## 2016-03-01 ENCOUNTER — Ambulatory Visit: Payer: Medicaid Other | Admitting: Physical Therapy

## 2016-03-06 ENCOUNTER — Encounter: Payer: Self-pay | Admitting: Physical Therapy

## 2016-03-06 ENCOUNTER — Ambulatory Visit: Payer: Medicaid Other | Admitting: Physical Therapy

## 2016-03-06 DIAGNOSIS — M25511 Pain in right shoulder: Secondary | ICD-10-CM | POA: Diagnosis not present

## 2016-03-06 DIAGNOSIS — G8929 Other chronic pain: Secondary | ICD-10-CM

## 2016-03-06 DIAGNOSIS — M5441 Lumbago with sciatica, right side: Secondary | ICD-10-CM
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
## 2016-03-06 NOTE — Patient Instructions (Addendum)
Circle (Supine)    Lie on back, holding _2_ pound ball or soup can above chest in left hand. Move arm in small circles clockwise, arm straight. Repeat __ times. Repeat with other hand for set. Rest _5_ seconds after set. Do _2_ sets per session.  Copyright  VHI. All rights reserved.  Press (Dumbbell)   Lying on back Lift arm straight up, keeping elbow straight, punch towards ceiling and then relax.  Do _1___ sets. Complete 10____ repetitions.   http://sb.exer.us/129   Copyright  VHI. All rights reserved.  All Fours Push-Up With Press-Up    Standing with hands shoulder-width apart against wall , KEEP ELBOW STRAIGHT and push through arms rounding upper back. Return and press shoulders up, arching upper back. Return. Repeat _10___ times  http://cc.exer.us/63   Copyright  VHI. All rights reserved.  Flexion (Passive)    Standing, push hand down into table, walk backwards until stretch is felt into shoulder. Hold _5___ seconds. Repeat 5____ times. Do __2__ sessions per day.  Copyright  VHI. All rights reserved.

## 2016-03-08 ENCOUNTER — Encounter: Payer: Self-pay | Admitting: Physical Therapy

## 2016-03-08 ENCOUNTER — Ambulatory Visit: Payer: Medicaid Other | Admitting: Physical Therapy

## 2016-03-08 DIAGNOSIS — M25511 Pain in right shoulder: Secondary | ICD-10-CM | POA: Diagnosis not present

## 2016-03-08 DIAGNOSIS — M5441 Lumbago with sciatica, right side: Secondary | ICD-10-CM

## 2016-03-08 DIAGNOSIS — G8929 Other chronic pain: Secondary | ICD-10-CM
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
## 2016-03-12 ENCOUNTER — Encounter: Payer: Medicaid Other | Admitting: Physical Therapy

## 2016-03-15 ENCOUNTER — Encounter: Payer: Self-pay | Admitting: Physical Therapy

## 2016-03-15 ENCOUNTER — Ambulatory Visit: Payer: Medicaid Other | Attending: Surgery | Admitting: Physical Therapy

## 2016-03-15 DIAGNOSIS — G8929 Other chronic pain: Secondary | ICD-10-CM

## 2016-03-15 DIAGNOSIS — M5441 Lumbago with sciatica, right side: Secondary | ICD-10-CM | POA: Diagnosis present

## 2016-03-15 DIAGNOSIS — M25511 Pain in right shoulder: Secondary | ICD-10-CM
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
## 2016-03-22 ENCOUNTER — Ambulatory Visit: Payer: Medicaid Other | Admitting: Physical Therapy

## 2016-03-28 ENCOUNTER — Encounter: Payer: Medicaid Other | Admitting: Physical Therapy

## 2016-03-29 ENCOUNTER — Ambulatory Visit: Payer: Medicaid Other

## 2016-03-29 DIAGNOSIS — M25511 Pain in right shoulder: Secondary | ICD-10-CM

## 2016-03-29 DIAGNOSIS — M5441 Lumbago with sciatica, right side: Secondary | ICD-10-CM

## 2016-03-29 DIAGNOSIS — G8929 Other chronic pain: Secondary | ICD-10-CM
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
## 2016-03-29 NOTE — Addendum Note (Signed)
Addended by: Bethanie DickerISSELL, [place] V on: 03/29/2016 04:43 PM   Modules accepted: Orders

## 2016-04-05 ENCOUNTER — Ambulatory Visit: Payer: Medicaid Other

## 2016-04-05 DIAGNOSIS — M5441 Lumbago with sciatica, right side: Secondary | ICD-10-CM

## 2016-04-05 DIAGNOSIS — G8929 Other chronic pain: Secondary | ICD-10-CM

## 2016-04-05 DIAGNOSIS — M25511 Pain in right shoulder: Secondary | ICD-10-CM
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
## 2016-04-09 ENCOUNTER — Other Ambulatory Visit: Payer: Self-pay | Admitting: Neurology

## 2016-04-09 DIAGNOSIS — R200 Anesthesia of skin: Secondary | ICD-10-CM

## 2016-04-09 DIAGNOSIS — R2689 Other abnormalities of gait and mobility: Secondary | ICD-10-CM

## 2016-04-09 DIAGNOSIS — R202 Paresthesia of skin: Principal | ICD-10-CM

## 2016-04-09 DIAGNOSIS — R42 Dizziness and giddiness: Secondary | ICD-10-CM

## 2016-04-11 ENCOUNTER — Ambulatory Visit: Payer: Medicaid Other

## 2016-04-11 DIAGNOSIS — M25511 Pain in right shoulder: Secondary | ICD-10-CM

## 2016-04-11 DIAGNOSIS — M5441 Lumbago with sciatica, right side: Secondary | ICD-10-CM

## 2016-04-11 DIAGNOSIS — G8929 Other chronic pain: Secondary | ICD-10-CM
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
## 2016-04-18 ENCOUNTER — Ambulatory Visit: Payer: Medicaid Other

## 2016-04-20 ENCOUNTER — Ambulatory Visit: Payer: No Typology Code available for payment source

## 2016-04-26 ENCOUNTER — Ambulatory Visit: Payer: Medicaid Other | Attending: Surgery

## 2016-04-26 DIAGNOSIS — M6281 Muscle weakness (generalized): Secondary | ICD-10-CM | POA: Insufficient documentation

## 2016-04-26 DIAGNOSIS — G8929 Other chronic pain: Secondary | ICD-10-CM | POA: Diagnosis present

## 2016-04-26 DIAGNOSIS — M25511 Pain in right shoulder: Secondary | ICD-10-CM | POA: Insufficient documentation

## 2016-04-26 DIAGNOSIS — M5441 Lumbago with sciatica, right side: Secondary | ICD-10-CM | POA: Insufficient documentation
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
## 2016-05-02 ENCOUNTER — Ambulatory Visit: Payer: Medicaid Other

## 2016-05-02 DIAGNOSIS — M5441 Lumbago with sciatica, right side: Secondary | ICD-10-CM

## 2016-05-02 DIAGNOSIS — G8929 Other chronic pain: Secondary | ICD-10-CM

## 2016-05-02 DIAGNOSIS — M25511 Pain in right shoulder: Secondary | ICD-10-CM | POA: Diagnosis not present
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
## 2016-05-07 ENCOUNTER — Ambulatory Visit: Payer: Medicaid Other

## 2016-05-07 DIAGNOSIS — M25511 Pain in right shoulder: Secondary | ICD-10-CM

## 2016-05-07 DIAGNOSIS — G8929 Other chronic pain: Secondary | ICD-10-CM

## 2016-05-07 DIAGNOSIS — M5441 Lumbago with sciatica, right side: Principal | ICD-10-CM

## 2016-05-07 DIAGNOSIS — M6281 Muscle weakness (generalized): Secondary | ICD-10-CM
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
## 2016-05-09 ENCOUNTER — Ambulatory Visit: Payer: Medicaid Other

## 2016-05-09 DIAGNOSIS — M6281 Muscle weakness (generalized): Secondary | ICD-10-CM

## 2016-05-09 DIAGNOSIS — M25511 Pain in right shoulder: Secondary | ICD-10-CM | POA: Diagnosis not present

## 2016-05-09 DIAGNOSIS — G8929 Other chronic pain: Secondary | ICD-10-CM

## 2016-05-09 DIAGNOSIS — M5441 Lumbago with sciatica, right side: Secondary | ICD-10-CM
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
## 2016-05-15 ENCOUNTER — Ambulatory Visit: Payer: Medicaid Other | Attending: Family Medicine

## 2016-05-15 DIAGNOSIS — M25511 Pain in right shoulder: Secondary | ICD-10-CM

## 2016-05-15 DIAGNOSIS — M6281 Muscle weakness (generalized): Secondary | ICD-10-CM

## 2016-05-15 DIAGNOSIS — M5441 Lumbago with sciatica, right side: Secondary | ICD-10-CM | POA: Insufficient documentation

## 2016-05-15 DIAGNOSIS — G8929 Other chronic pain: Secondary | ICD-10-CM | POA: Diagnosis present
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
## 2016-05-22 ENCOUNTER — Ambulatory Visit: Payer: Medicaid Other

## 2016-05-31 ENCOUNTER — Ambulatory Visit: Payer: Medicaid Other | Attending: Family Medicine

## 2016-05-31 DIAGNOSIS — M5441 Lumbago with sciatica, right side: Secondary | ICD-10-CM | POA: Diagnosis present

## 2016-05-31 DIAGNOSIS — M25511 Pain in right shoulder: Secondary | ICD-10-CM

## 2016-05-31 DIAGNOSIS — G8929 Other chronic pain: Secondary | ICD-10-CM | POA: Diagnosis present

## 2016-05-31 DIAGNOSIS — M6281 Muscle weakness (generalized): Secondary | ICD-10-CM | POA: Diagnosis present
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
## 2016-06-06 ENCOUNTER — Ambulatory Visit: Payer: Medicaid Other

## 2016-06-06 DIAGNOSIS — M5441 Lumbago with sciatica, right side: Secondary | ICD-10-CM

## 2016-06-06 DIAGNOSIS — M25511 Pain in right shoulder: Secondary | ICD-10-CM | POA: Diagnosis not present

## 2016-06-06 DIAGNOSIS — G8929 Other chronic pain: Secondary | ICD-10-CM
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
## 2016-06-12 ENCOUNTER — Ambulatory Visit: Payer: Medicaid Other | Attending: Family Medicine | Admitting: Physical Therapy

## 2016-06-12 DIAGNOSIS — G8929 Other chronic pain: Secondary | ICD-10-CM | POA: Insufficient documentation

## 2016-06-12 DIAGNOSIS — M25511 Pain in right shoulder: Secondary | ICD-10-CM | POA: Insufficient documentation

## 2016-06-12 DIAGNOSIS — M5441 Lumbago with sciatica, right side: Secondary | ICD-10-CM | POA: Insufficient documentation

## 2016-06-12 DIAGNOSIS — M6281 Muscle weakness (generalized): Secondary | ICD-10-CM | POA: Insufficient documentation

## 2016-06-21 ENCOUNTER — Ambulatory Visit: Payer: Medicaid Other | Admitting: Physical Therapy

## 2016-06-21 DIAGNOSIS — G8929 Other chronic pain: Secondary | ICD-10-CM | POA: Diagnosis present

## 2016-06-21 DIAGNOSIS — M6281 Muscle weakness (generalized): Secondary | ICD-10-CM

## 2016-06-21 DIAGNOSIS — M25511 Pain in right shoulder: Secondary | ICD-10-CM

## 2016-06-21 DIAGNOSIS — M5441 Lumbago with sciatica, right side: Secondary | ICD-10-CM
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
## 2016-06-26 ENCOUNTER — Ambulatory Visit: Payer: Medicaid Other | Admitting: Physical Therapy

## 2016-06-26 DIAGNOSIS — M6281 Muscle weakness (generalized): Secondary | ICD-10-CM

## 2016-06-26 DIAGNOSIS — M5441 Lumbago with sciatica, right side: Secondary | ICD-10-CM

## 2016-06-26 DIAGNOSIS — G8929 Other chronic pain: Secondary | ICD-10-CM

## 2016-06-26 DIAGNOSIS — M25511 Pain in right shoulder: Secondary | ICD-10-CM
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
## 2016-07-03 ENCOUNTER — Ambulatory Visit: Payer: Medicaid Other | Admitting: Physical Therapy

## 2016-07-03 DIAGNOSIS — G8929 Other chronic pain: Secondary | ICD-10-CM

## 2016-07-03 DIAGNOSIS — M6281 Muscle weakness (generalized): Secondary | ICD-10-CM | POA: Diagnosis not present

## 2016-07-03 DIAGNOSIS — M5441 Lumbago with sciatica, right side: Secondary | ICD-10-CM

## 2016-07-03 DIAGNOSIS — M25511 Pain in right shoulder: Secondary | ICD-10-CM
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
## 2016-07-10 ENCOUNTER — Ambulatory Visit: Payer: Medicaid Other | Admitting: Physical Therapy

## 2016-07-10 DIAGNOSIS — M6281 Muscle weakness (generalized): Secondary | ICD-10-CM | POA: Diagnosis not present

## 2016-07-10 DIAGNOSIS — M25511 Pain in right shoulder: Secondary | ICD-10-CM

## 2016-07-10 DIAGNOSIS — G8929 Other chronic pain: Secondary | ICD-10-CM

## 2016-07-10 DIAGNOSIS — M5441 Lumbago with sciatica, right side: Secondary | ICD-10-CM
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
## 2016-07-19 ENCOUNTER — Ambulatory Visit: Payer: Medicaid Other | Attending: Family Medicine | Admitting: Physical Therapy

## 2016-07-19 DIAGNOSIS — M6281 Muscle weakness (generalized): Secondary | ICD-10-CM | POA: Diagnosis present

## 2016-07-19 DIAGNOSIS — M5441 Lumbago with sciatica, right side: Secondary | ICD-10-CM | POA: Insufficient documentation

## 2016-07-19 DIAGNOSIS — G8929 Other chronic pain: Secondary | ICD-10-CM

## 2016-07-19 DIAGNOSIS — M25511 Pain in right shoulder: Secondary | ICD-10-CM | POA: Diagnosis present
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
## 2016-07-23 NOTE — Patient Instructions (Signed)
Stretches   L side bend 10 reps / x3    Open book rotating R only.  10 x 2    Strengthening  band under heels while laying on back w/ knees bent  "W" exercise  10 reps x 2 sets   Band is placed under feet, knees bent, feet are hip width apart Hold band with thumbs point out, keep upper arm and elbow touching the bed the whole time  - inhale and then exhale pull bands by bending elbows hands move in a "w"  (feel shoulder blades squeezing)    __________________

## 2016-07-24 ENCOUNTER — Ambulatory Visit: Payer: Medicaid Other | Admitting: Physical Therapy

## 2016-07-24 DIAGNOSIS — G8929 Other chronic pain: Secondary | ICD-10-CM

## 2016-07-24 DIAGNOSIS — M6281 Muscle weakness (generalized): Secondary | ICD-10-CM | POA: Diagnosis not present

## 2016-07-24 DIAGNOSIS — M5441 Lumbago with sciatica, right side: Secondary | ICD-10-CM

## 2016-07-24 DIAGNOSIS — M25511 Pain in right shoulder: Secondary | ICD-10-CM

## 2016-07-25 ENCOUNTER — Ambulatory Visit: Payer: Medicaid Other | Attending: Neurology

## 2016-07-25 DIAGNOSIS — R0683 Snoring: Secondary | ICD-10-CM | POA: Diagnosis not present

## 2016-07-25 DIAGNOSIS — E669 Obesity, unspecified: Secondary | ICD-10-CM | POA: Diagnosis not present

## 2016-07-25 DIAGNOSIS — G4733 Obstructive sleep apnea (adult) (pediatric): Secondary | ICD-10-CM | POA: Diagnosis present

## 2016-07-25 DIAGNOSIS — I10 Essential (primary) hypertension: Secondary | ICD-10-CM | POA: Diagnosis not present

## 2016-07-25 DIAGNOSIS — Z79899 Other long term (current) drug therapy: Secondary | ICD-10-CM | POA: Insufficient documentation

## 2016-07-25 DIAGNOSIS — Z6841 Body Mass Index (BMI) 40.0 and over, adult: Secondary | ICD-10-CM | POA: Diagnosis not present
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
## 2016-07-25 NOTE — Patient Instructions (Signed)
Seated deep core level 2 (knee out)  30 reps or 6 min x/ 2 day   Incremental (graded movement) with open book  sidebend , seated with elbow pointing up, hand on shoulder  10 reps x 2

## 2016-08-07 ENCOUNTER — Ambulatory Visit: Payer: Medicaid Other | Admitting: Physical Therapy

## 2016-08-17 ENCOUNTER — Ambulatory Visit: Payer: Medicaid Other | Attending: Family Medicine | Admitting: Physical Therapy

## 2016-08-17 DIAGNOSIS — M5441 Lumbago with sciatica, right side: Secondary | ICD-10-CM | POA: Diagnosis present

## 2016-08-17 DIAGNOSIS — M25511 Pain in right shoulder: Secondary | ICD-10-CM

## 2016-08-17 DIAGNOSIS — G8929 Other chronic pain: Secondary | ICD-10-CM

## 2016-08-17 DIAGNOSIS — M6281 Muscle weakness (generalized): Secondary | ICD-10-CM

## 2016-08-17 NOTE — Patient Instructions (Addendum)
Seated   Lifting the base of skull with interlaced palms 5reps  Chin tucks 5 sec holds x 5  X 5x day with palms flat by the hips, press hands down to bed , shoulders down     ________  Laying down  Tuck chin, bring R ear to R shoulder  10 reps     Head press/ shoulder press to bed and pillow under head 5 sec x reps ,  2-3 x day  _______
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
## 2016-08-28 ENCOUNTER — Ambulatory Visit: Payer: Medicaid Other | Attending: Neurology

## 2016-08-28 DIAGNOSIS — G4733 Obstructive sleep apnea (adult) (pediatric): Secondary | ICD-10-CM | POA: Diagnosis present

## 2016-08-28 DIAGNOSIS — R0683 Snoring: Secondary | ICD-10-CM | POA: Insufficient documentation

## 2016-08-30 ENCOUNTER — Ambulatory Visit: Payer: Medicaid Other | Admitting: Physical Therapy

## 2016-08-30 DIAGNOSIS — M6281 Muscle weakness (generalized): Secondary | ICD-10-CM

## 2016-08-30 DIAGNOSIS — G8929 Other chronic pain: Secondary | ICD-10-CM

## 2016-08-30 DIAGNOSIS — M5441 Lumbago with sciatica, right side: Secondary | ICD-10-CM

## 2016-08-30 DIAGNOSIS — M25511 Pain in right shoulder: Secondary | ICD-10-CM
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
## 2016-09-06 ENCOUNTER — Ambulatory Visit: Payer: Medicaid Other | Admitting: Physical Therapy

## 2016-09-06 DIAGNOSIS — M5441 Lumbago with sciatica, right side: Secondary | ICD-10-CM

## 2016-09-06 DIAGNOSIS — M6281 Muscle weakness (generalized): Secondary | ICD-10-CM

## 2016-09-06 DIAGNOSIS — M25511 Pain in right shoulder: Secondary | ICD-10-CM

## 2016-09-06 DIAGNOSIS — G8929 Other chronic pain: Secondary | ICD-10-CM
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
## 2016-10-10 ENCOUNTER — Ambulatory Visit: Payer: Medicaid Other | Admitting: Physical Therapy

## 2016-10-11 ENCOUNTER — Ambulatory Visit: Payer: Medicaid Other | Attending: Family Medicine | Admitting: Physical Therapy

## 2016-10-11 DIAGNOSIS — G8929 Other chronic pain: Secondary | ICD-10-CM

## 2016-10-11 DIAGNOSIS — M6281 Muscle weakness (generalized): Secondary | ICD-10-CM

## 2016-10-11 DIAGNOSIS — M25511 Pain in right shoulder: Secondary | ICD-10-CM

## 2016-10-11 DIAGNOSIS — M5441 Lumbago with sciatica, right side: Secondary | ICD-10-CM | POA: Diagnosis present

## 2016-10-11 NOTE — Patient Instructions (Addendum)
Upper trap stretch ( handout)   [email or web address]   ( [employer])  [email or web address]
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
## 2016-10-18 ENCOUNTER — Emergency Department
Admission: EM | Admit: 2016-10-18 | Discharge: 2016-10-18 | Disposition: A | Discharge status: Home / Self Care | Payer: Medicaid Other | Attending: Emergency Medicine | Admitting: Emergency Medicine

## 2016-10-18 ENCOUNTER — Ambulatory Visit: Payer: No Typology Code available for payment source | Admitting: Physical Therapy

## 2016-10-18 ENCOUNTER — Emergency Department: Payer: Medicaid Other

## 2016-10-18 ENCOUNTER — Encounter: Payer: Self-pay | Admitting: Emergency Medicine

## 2016-10-18 DIAGNOSIS — Z87891 Personal history of nicotine dependence: Secondary | ICD-10-CM | POA: Diagnosis not present

## 2016-10-18 DIAGNOSIS — G8929 Other chronic pain: Secondary | ICD-10-CM | POA: Diagnosis not present

## 2016-10-18 DIAGNOSIS — N390 Urinary tract infection, site not specified: Secondary | ICD-10-CM | POA: Insufficient documentation

## 2016-10-18 DIAGNOSIS — I10 Essential (primary) hypertension: Secondary | ICD-10-CM | POA: Diagnosis not present

## 2016-10-18 DIAGNOSIS — F419 Anxiety disorder, unspecified: Secondary | ICD-10-CM | POA: Diagnosis present

## 2016-10-18 DIAGNOSIS — R002 Palpitations: Secondary | ICD-10-CM | POA: Insufficient documentation

## 2016-10-18 HISTORY — DX: Sleep apnea, unspecified: G47.30

## 2016-10-18 HISTORY — DX: Bipolar disorder, unspecified: F31.9

## 2016-10-18 HISTORY — DX: Other chronic pain: G89.29

## 2016-10-18 HISTORY — DX: Essential (primary) hypertension: I10

## 2016-10-18 HISTORY — DX: Migraine, unspecified, not intractable, without status migrainosus: G43.909

## 2016-10-18 HISTORY — DX: Cardiac arrhythmia, unspecified: I49.9

## 2016-10-18 LAB — URINALYSIS, ROUTINE W REFLEX MICROSCOPIC
BACTERIA UA: NONE SEEN
Bilirubin Urine: NEGATIVE
Glucose, UA: NEGATIVE mg/dL
Hgb urine dipstick: NEGATIVE
Ketones, ur: NEGATIVE mg/dL
Nitrite: NEGATIVE
PROTEIN: NEGATIVE mg/dL
Specific Gravity, Urine: 1.019 (ref 1.005–1.030)
pH: 6 (ref 5.0–8.0)

## 2016-10-18 LAB — CBC
HEMATOCRIT: 38.7 % (ref 35.0–47.0)
Hemoglobin: 13.6 g/dL (ref 12.0–16.0)
MCH: 30 pg (ref 26.0–34.0)
MCHC: 35 g/dL (ref 32.0–36.0)
MCV: 85.6 fL (ref 80.0–100.0)
Platelets: 221 10*3/uL (ref 150–440)
RBC: 4.53 MIL/uL (ref 3.80–5.20)
RDW: 15 % — AB (ref 11.5–14.5)
WBC: 8.5 10*3/uL (ref 3.6–11.0)

## 2016-10-18 LAB — BASIC METABOLIC PANEL
Anion gap: 8 (ref 5–15)
BUN: 13 mg/dL (ref 6–20)
CHLORIDE: 112 mmol/L — AB (ref 101–111)
CO2: 19 mmol/L — ABNORMAL LOW (ref 22–32)
CREATININE: 0.81 mg/dL (ref 0.44–1.00)
Calcium: 9.2 mg/dL (ref 8.9–10.3)
GFR calc non Af Amer: >60 mL/min (ref >60)
Glucose, Bld: 114 mg/dL — ABNORMAL HIGH (ref 65–99)
Potassium: 3.7 mmol/L (ref 3.5–5.1)
SODIUM: 139 mmol/L (ref 135–145)

## 2016-10-18 LAB — POCT PREGNANCY, URINE: PREG TEST UR: NEGATIVE

## 2016-10-18 LAB — TROPONIN I: Troponin I: <0.03 ng/mL (ref <0.03)

## 2016-10-18 MED ORDER — CLONAZEPAM 0.5 MG PO TABS: 0.5000 mg | ORAL_TABLET | Freq: Every day | ORAL | 0 refills | Status: DC

## 2016-10-18 MED ORDER — FOSFOMYCIN TROMETHAMINE 3 G PO PACK
3.0000 g | PACK | Freq: Once | ORAL | Status: AC
  Administered 2016-10-18: 3 g via ORAL
  Filled 2016-10-18 (×2): qty 3

## 2016-10-18 NOTE — ED Triage Notes (Signed)
Patient presents to the ED with increased anxiety x 1 week with palpitations, chest pain and occasional shortness of breath.  Patient reports difficulty occasionally focusing her vision.  Patient states she recently began a new blood pressure medication on August 21st (propanolol) and her grandmother recent died on Aug. 9th.  Patient reports history of anxiety and bipolar.  Patient is calm and cooperative at this time.

## 2016-10-18 NOTE — ED Provider Notes (Addendum)
[facility] Emergency Department Provider Note  Time seen: 5:28 PM  I have reviewed the triage vital signs and the nursing notes.   HISTORY  Chief Complaint Anxiety and Palpitations    HPI Melinda Mendoza is a 31 y.o. female With a past medical history of bipolar, hypertension, anxiety, presents to the emergency department for palpitations and headaches. According to the patient she has been under a lot of stress recently. States her mom passed away earlier this year and grandmother passed away last month. States over the past several weeks she has been having significant palpitations with occasional visual disturbances. States for the past one week she has been experiencing near daily headaches which do resolve and then come back. patient also states intermittent chest pain with her palpitations. She called her doctor to be seen and they recommended she come to the emergency department. Here the patient appears well, states occasional palpitations. States she feels anxious.  Past Medical History:  Diagnosis Date  . Bipolar 1 disorder (HCC)   . Chronic pain   . Hypertension   . Irregular heartbeat   . Migraine   . Sleep apnea     There are no active problems to display for this patient.   Past Surgical History:  Procedure Laterality Date  . CHOLECYSTECTOMY    . laproscopy for IUD removal      Prior to Admission medications   Medication Sig Start Date End Date Taking? Authorizing Provider  amoxicillin-clavulanate (AUGMENTIN) 875-125 MG per tablet Take 1 tablet by mouth 2 (two) times daily. Patient not taking: Reported on 02/01/2016 11/08/14   Hassan RowanWade, Eugene, MD  baclofen (LIORESAL) 10 MG tablet Take 1 tablet (10 mg total) by mouth 3 (three) times daily. Patient not taking: Reported on 02/01/2016 09/20/15   Kem Boroughsriplett, Cari B, FNP  cyclobenzaprine (FLEXERIL) 10 MG tablet Take 1 tablet (10 mg total) by mouth at bedtime. Patient not taking: Reported on 02/01/2016  12/07/14   Payton Mccallumonty, Orlando, MD  etonogestrel (NEXPLANON) 68 MG IMPL implant 1 each by Subdermal route once.    [provider]  fexofenadine-pseudoephedrine (ALLEGRA-D ALLERGY & CONGESTION) 180-240 MG per 24 hr tablet Take 1 tablet by mouth daily. Patient not taking: Reported on 02/01/2016 11/08/14   Hassan RowanWade, Eugene, MD  lamoTRIgine (LAMICTAL) 100 MG tablet Take 100 mg by mouth 2 (two) times daily.    [provider]  mometasone (NASONEX) 50 MCG/ACT nasal spray Place 2 sprays into the nose daily. 11/08/14   Hassan RowanWade, Eugene, MD  nortriptyline (PAMELOR) 25 MG capsule Take 50 mg by mouth at bedtime.    [provider]  oxyCODONE-acetaminophen (PERCOCET) 5-325 MG tablet Take 2 tablets by mouth every 6 (six) hours as needed for moderate pain or severe pain. Patient not taking: Reported on 02/01/2016 10/19/15   Emily FilbertWilliams, Jonathan E, MD  predniSONE (STERAPRED UNI-PAK 21 TAB) 10 MG (21) TBPK tablet Take 6 tablets on day 1 Take 5 tablets on day 2 Take 4 tablets on day 3 Take 3 tablets on day 4 Take 2 tablets on day 5 Take 1 tablet on day 6 09/20/15   Triplett, Cari B, FNP  tiZANidine (ZANAFLEX) 4 MG capsule Take 4 mg by mouth 3 (three) times daily.    [provider]    No Known Allergies  Family History  Problem Relation Age of Onset  . Heart disease Mother   . Heart disease Maternal Grandmother     Social History Social History  Substance Use Topics  .  Smoking status: Former Smoker    Quit date: 10/19/2011  . Smokeless tobacco: Never Used  . Alcohol use Yes     Comment: rarely- once per year    Review of Systems Constitutional: Negative for fever Cardiovascular: intermittent chest pain. Positive for palpitations. Respiratory: Negative for shortness of breath. Gastrointestinal: Negative for abdominal pain, vomiting and diarrhea. Genitourinary: Negative for dysuria. Musculoskeletal: Negative for back pain. Skin: Negative for rash. Neurological: intermittent  headache. Denies weakness or numbness All other ROS negative  ____________________________________________   PHYSICAL EXAM:  VITAL SIGNS: ED Triage Vitals  Enc Vitals Group     BP 10/18/16 1540 (!) 158/97     Pulse Rate 10/18/16 1540 64     Resp 10/18/16 1540 18     Temp 10/18/16 1540 98.4 F (36.9 C)     Temp Source 10/18/16 1540 Oral     SpO2 10/18/16 1540 100 %     Weight 10/18/16 1541 248 lb (112.5 kg)     Height 10/18/16 1541  (1.626 m)     Head Circumference --      Peak Flow --      Pain Score 10/18/16 1540 6     Pain Loc --      Pain Edu? --      Excl. in GC? --     Constitutional: Alert and oriented. Well appearing and in no distress. Eyes: Normal exam ENT   Head: Normocephalic and atraumatic   Mouth/Throat: Mucous membranes are moist. Cardiovascular: Normal rate, regular rhythm. No murmur Respiratory: Normal respiratory effort without tachypnea nor retractions. Breath sounds are clear  Gastrointestinal: Soft and nontender. No distention.   Musculoskeletal: Nontender with normal range of motion in all extremities. Neurologic:  Normal speech and language. No gross focal neurologic deficits  Skin:  Skin is warm, dry and intact.  Psychiatric: Mood and affect are normal.   ____________________________________________    EKG  EKG reviewed and interpreted by myself shows normal sinus rhythm at 65 bpm, narrow QRS, normal axis, normal intervals, no concerning ST changes.  ____________________________________________    RADIOLOGY  x-ray negative  ____________________________________________   INITIAL IMPRESSION / ASSESSMENT AND PLAN / ED COURSE  Pertinent labs & imaging results that were available during my care of the patient were reviewed by me and considered in my medical decision making (see chart for details).  patient presents to the emergency department for palpitations, headaches, which she relates to anxiety/panic attack. States they  are occurring multiple times throughout the day. the patient states her doctor recently put her on propranolol for palpitations as well as her headaches. States no relief after starting the medication. Overall the patient appears well, her symptoms are very suggestive of stress/anxiety-induced symptoms. I discussed with the patient follow-up with a cardiologist for a Holter monitor. We will prescribe a very short course of Klonopin. I discussed with the patient not drinking alcohol, no driving, etc. Patient will follow up with her doctor.  urinalysis shows possible urinary tract infection. We will treat with a one-time dose of fosfomycin and send a urine culture. Pregnancy test negative.  FINAL CLINICAL IMPRESSION(S) / ED DIAGNOSES  Palpitations Anxiety    Minna Antis, MD 10/18/16 Garnette Scheuermann    Minna Antis, MD 10/18/16 [phone]

## 2016-10-18 NOTE — ED Notes (Signed)
Pt denies chest pain at this time.

## 2016-10-20 LAB — URINE CULTURE

## 2016-11-07 ENCOUNTER — Ambulatory Visit: Payer: No Typology Code available for payment source | Attending: Family Medicine | Admitting: Physical Therapy

## 2016-11-07 VITALS — BP 122/60 | HR 68

## 2016-11-07 DIAGNOSIS — G8929 Other chronic pain: Secondary | ICD-10-CM

## 2016-11-07 DIAGNOSIS — M25511 Pain in right shoulder: Secondary | ICD-10-CM

## 2016-11-07 DIAGNOSIS — M6281 Muscle weakness (generalized): Secondary | ICD-10-CM

## 2016-11-07 DIAGNOSIS — M5441 Lumbago with sciatica, right side: Secondary | ICD-10-CM | POA: Insufficient documentation

## 2016-11-07 NOTE — Addendum Note (Signed)
Addended by: Mariane Masters on: 11/07/2016 04:04 PM   Modules accepted: Orders

## 2016-11-07 NOTE — Therapy (Deleted)
[place] [facility] MAIN [facility] SERVICES [address] [place], [place], [address] Phone: [phone]   Fax:  [phone]  Patient Details  Name: Melinda Mendoza MRN: [id number] Date of Birth: 1985/04/19 Referring Provider:  Vivi Barrack*  Encounter Date: 11/07/2016   Mariane Masters 11/07/2016, 4:05 PM  [place] [facility] MAIN [facility] SERVICES [address] [place], [place], [address] Phone: [phone]   Fax:  [phone]
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
## 2016-11-15 DIAGNOSIS — F329 Major depressive disorder, single episode, unspecified: Secondary | ICD-10-CM | POA: Insufficient documentation

## 2016-11-15 DIAGNOSIS — F4322 Adjustment disorder with anxiety: Secondary | ICD-10-CM | POA: Insufficient documentation

## 2017-03-18 ENCOUNTER — Ambulatory Visit: Payer: Medicaid Other | Admitting: Neurology

## 2017-03-18 ENCOUNTER — Encounter: Payer: Self-pay | Admitting: Neurology

## 2017-03-18 VITALS — BP 90/50 | HR 65 | Ht 63.0 in | Wt 247.6 lb

## 2017-03-18 DIAGNOSIS — M542 Cervicalgia: Secondary | ICD-10-CM

## 2017-03-18 DIAGNOSIS — G43009 Migraine without aura, not intractable, without status migrainosus: Secondary | ICD-10-CM

## 2017-03-18 MED ORDER — SUMATRIPTAN SUCCINATE 100 MG PO TABS: ORAL_TABLET | ORAL | 2 refills | Status: DC

## 2017-03-18 MED ORDER — TOPIRAMATE 100 MG PO TABS: 200.0000 mg | ORAL_TABLET | Freq: Every day | ORAL | 2 refills | Status: DC

## 2017-03-18 NOTE — Patient Instructions (Addendum)
Migraine Recommendations: 1.  Increase topiramate to 200mg  at bedtime.  Call in 6 weeks with update. 2.  Take sumatriptan 100mg  at earliest onset of headache.  May repeat dose once in 2 hours if needed.  Do not exceed two tablets in 24 hours.  Continue tizanidine at bedtime. 3.  Limit use of pain relievers to no more than 2 days out of the week.  These medications include acetaminophen, ibuprofen, triptans and narcotics.  This will help reduce risk of rebound headaches. 4.  Be aware of common food triggers such as processed sweets, processed foods with nitrites (such as deli meat, hot dogs, sausages), foods with MSG, alcohol (such as wine), chocolate, certain cheeses, certain fruits (dried fruits, bananas, some citrus fruit), vinegar, diet soda. 4.  Avoid caffeine 5.  Routine exercise 6.  Proper sleep hygiene 7.  Stay adequately hydrated with water 8.  Keep a headache diary. 9.  Maintain proper stress management. 10.  Do not skip meals. 11.  Consider supplements:  Magnesium citrate 400mg  to 600mg  daily, riboflavin 400mg , Coenzyme Q 10 100mg  three times daily 12.  Follow up in 3 months.
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
## 2017-05-09 ENCOUNTER — Other Ambulatory Visit: Payer: Self-pay

## 2017-05-09 ENCOUNTER — Ambulatory Visit
Admission: EM | Admit: 2017-05-09 | Discharge: 2017-05-09 | Disposition: A | Discharge status: Home / Self Care | Payer: Medicaid Other | Attending: Family Medicine | Admitting: Family Medicine

## 2017-05-09 DIAGNOSIS — J069 Acute upper respiratory infection, unspecified: Secondary | ICD-10-CM | POA: Diagnosis not present

## 2017-05-09 DIAGNOSIS — B9789 Other viral agents as the cause of diseases classified elsewhere: Secondary | ICD-10-CM

## 2017-05-09 MED ORDER — PREDNISONE 50 MG PO TABS: ORAL_TABLET | ORAL | 0 refills | Status: DC

## 2017-05-09 MED ORDER — HYDROCOD POLST-CPM POLST ER 10-8 MG/5ML PO SUER: 5.0000 mL | Freq: Two times a day (BID) | ORAL | 0 refills | Status: DC | PRN

## 2017-05-09 NOTE — ED Provider Notes (Signed)
[facility]    CSN: [id number] Arrival date & time: 05/09/17  1227  History   Chief Complaint Chief Complaint  Patient presents with  . Cough   HPI  32 year old female presents with several complaints.  Patient reports that she has had runny nose since last week.  She developed cough on Saturday.  She states that the cough is severe and keeps her up at night.  She reports headache and fatigue as well.  Also reports diarrhea.  No fever.  He is taken over-the-counter cough medication without relief.  No known exacerbating factors.  No other associated symptoms.  No other complaints.  Past Medical History:  Diagnosis Date  . Bipolar 1 disorder (HCC)   . Chronic pain   . Hypertension   . Irregular heartbeat   . Migraine   . Sleep apnea    Past Surgical History:  Procedure Laterality Date  . CHOLECYSTECTOMY    . laproscopy for IUD removal     OB History   None    Home Medications    Prior to Admission medications   Medication Sig Start Date End Date Taking? Authorizing Provider  chlorpheniramine-HYDROcodone (TUSSIONEX PENNKINETIC ER) 10-8 MG/5ML SUER Take 5 mLs by mouth every 12 (twelve) hours as needed. 05/09/17   Tommie Samsook, Wallace Cogliano G, DO  clonazePAM (KLONOPIN) 0.5 MG tablet Take 1 tablet (0.5 mg total) by mouth daily. 10/18/16 10/25/16  Minna AntisPaduchowski, Kevin, MD  DULoxetine (CYMBALTA) 30 MG capsule Take 30 mg by mouth daily. 11/14/16 11/14/17  [provider]  lamoTRIgine (LAMICTAL) 100 MG tablet Take 100 mg by mouth 2 (two) times daily.    [provider]  norethindrone-ethinyl estradiol (JUNEL FE,GILDESS FE,LOESTRIN FE) 1-20 MG-MCG tablet Take 1 tablet by mouth daily. 03/11/17 03/11/18  [provider]  predniSONE (DELTASONE) 50 MG tablet 1 tablet daily x 5 days. 05/09/17   Tommie Samsook, Iktan Aikman G, DO  propranolol ER (INDERAL LA) 60 MG 24 hr capsule Take 60 mg by mouth daily. 11/14/16 11/14/17  [provider]  SUMAtriptan (IMITREX) 100 MG tablet Take 1  tablet earliest onset of migraine.  May repeat once in 2 hours if headache persists or recurs.  Do not exceed 2 tablets in 24 hours. 03/18/17   Drema DallasJaffe, Adam R, DO  tiZANidine (ZANAFLEX) 4 MG capsule Take 4 mg by mouth 3 (three) times daily.    [provider]  topiramate (TOPAMAX) 100 MG tablet Take 2 tablets (200 mg total) by mouth at bedtime. 03/18/17   Drema DallasJaffe, Adam R, DO   Family History Family History  Problem Relation Age of Onset  . Heart disease Mother   . Diabetes Mother   . Heart disease Maternal Grandmother   . Diverticulitis Maternal Grandmother   . Kidney failure Father   . Gestational diabetes Sister    Social History Social History   Tobacco Use  . Smoking status: Former Smoker    Last attempt to quit: 10/19/2011    Years since quitting: 5.5  . Smokeless tobacco: Never Used  Substance Use Topics  . Alcohol use: Yes    Comment: rarely- once per year  . Drug use: No   Allergies   Patient has no known allergies.  Review of Systems Review of Systems  Constitutional: Positive for fatigue. Negative for fever.  HENT: Positive for rhinorrhea.   Respiratory: Positive for cough.   Gastrointestinal: Positive for diarrhea.  Neurological: Positive for headaches.   Physical Exam Triage Vital Signs ED Triage Vitals  Enc  Vitals Group     BP 05/09/17 1307 140/81     Pulse Rate 05/09/17 1307 66     Resp 05/09/17 1307 18     Temp 05/09/17 1307 98.1 F (36.7 C)     Temp Source 05/09/17 1307 Oral     SpO2 05/09/17 1307 100 %     Weight 05/09/17 1308 246 lb (111.6 kg)     Height 05/09/17 1308 5\' 4"  (1.626 m)     Head Circumference --      Peak Flow --      Pain Score 05/09/17 1308 4     Pain Loc --      Pain Edu? --      Excl. in GC? --    Updated Vital Signs BP 140/81 (BP Location: Left Arm)   Pulse 66   Temp 98.1 F (36.7 C) (Oral)   Resp 18   Ht 5\' 4"  (1.626 m)   Wt 246 lb (111.6 kg)   LMP 05/01/2017   SpO2 100%   BMI 42.23 kg/m   Physical Exam    Constitutional: She appears well-developed. No distress.  HENT:  Head: Normocephalic and atraumatic.  Mouth/Throat: Oropharynx is clear and moist.  Eyes: Conjunctivae are normal. Right eye exhibits no discharge. Left eye exhibits no discharge.  Cardiovascular: Normal rate and regular rhythm.  Pulmonary/Chest: Effort normal and breath sounds normal. She has no wheezes. She has no rales.  Psychiatric:  Flat affect.  Nursing note and vitals reviewed.  UC Treatments / Results  Labs (all labs ordered are listed, but only abnormal results are displayed) Labs Reviewed - No data to display  EKG None Radiology No results found.  Procedures Procedures (including critical care time)  Medications Ordered in UC Medications - No data to display   Initial Impression / Assessment and Plan / UC Course  I have reviewed the triage vital signs and the nursing notes.  Pertinent labs & imaging results that were available during my care of the patient were reviewed by me and considered in my medical decision making (see chart for details).     32 year old female presents with viral URI with cough.  Treating with prednisone and tussionex.  Final Clinical Impressions(s) / UC Diagnoses   Final diagnoses:  Viral URI with cough    ED Discharge Orders        Ordered    predniSONE (DELTASONE) 50 MG tablet     05/09/17 1410    chlorpheniramine-HYDROcodone (TUSSIONEX PENNKINETIC ER) 10-8 MG/5ML SUER  Every 12 hours PRN     05/09/17 1410     Controlled Substance Prescriptions [place] Controlled Substance Registry consulted? Not Applicable   Tommie Sams, DO 05/09/17 1538

## 2017-05-09 NOTE — ED Triage Notes (Signed)
Pt with over a week of cough. Now with sore throat, congestion.

## 2017-05-09 NOTE — Discharge Instructions (Signed)
Rest. ° °Fluids. ° °Medications as prescribed. ° °Take care ° °Dr. Melisssa Donner  °

## 2017-06-07 ENCOUNTER — Other Ambulatory Visit: Payer: Self-pay | Admitting: Family Medicine

## 2017-06-07 DIAGNOSIS — N83201 Unspecified ovarian cyst, right side: Secondary | ICD-10-CM

## 2017-06-07 DIAGNOSIS — N83202 Unspecified ovarian cyst, left side: Principal | ICD-10-CM

## 2017-06-13 ENCOUNTER — Other Ambulatory Visit: Payer: Self-pay | Admitting: Student

## 2017-06-13 DIAGNOSIS — M5412 Radiculopathy, cervical region: Secondary | ICD-10-CM

## 2017-06-13 DIAGNOSIS — M546 Pain in thoracic spine: Secondary | ICD-10-CM

## 2017-06-13 DIAGNOSIS — M5416 Radiculopathy, lumbar region: Secondary | ICD-10-CM

## 2017-06-17 ENCOUNTER — Other Ambulatory Visit: Payer: Self-pay | Admitting: Neurology

## 2017-06-18 ENCOUNTER — Ambulatory Visit: Payer: Medicaid Other

## 2017-06-21 ENCOUNTER — Ambulatory Visit
Admission: RE | Admit: 2017-06-21 | Discharge: 2017-06-21 | Disposition: A | Discharge status: Home / Self Care | Payer: Medicaid Other | Source: Ambulatory Visit | Attending: Student | Admitting: Student

## 2017-06-21 ENCOUNTER — Ambulatory Visit: Payer: Medicaid Other

## 2017-06-21 ENCOUNTER — Ambulatory Visit: Payer: Self-pay

## 2017-06-21 ENCOUNTER — Other Ambulatory Visit: Payer: Self-pay

## 2017-06-21 DIAGNOSIS — M546 Pain in thoracic spine: Secondary | ICD-10-CM | POA: Diagnosis present

## 2017-06-21 DIAGNOSIS — G8929 Other chronic pain: Secondary | ICD-10-CM | POA: Diagnosis not present

## 2017-06-21 DIAGNOSIS — M5412 Radiculopathy, cervical region: Secondary | ICD-10-CM | POA: Diagnosis not present

## 2017-06-21 DIAGNOSIS — M5416 Radiculopathy, lumbar region: Secondary | ICD-10-CM | POA: Diagnosis not present

## 2017-06-26 ENCOUNTER — Ambulatory Visit: Payer: Medicaid Other | Admitting: Neurology

## 2017-06-26 ENCOUNTER — Encounter: Payer: Self-pay | Admitting: Neurology

## 2017-06-26 VITALS — BP 94/58 | HR 66 | Ht 63.0 in | Wt 245.0 lb

## 2017-06-26 DIAGNOSIS — G43009 Migraine without aura, not intractable, without status migrainosus: Secondary | ICD-10-CM
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
## 2017-06-26 NOTE — Patient Instructions (Signed)
1.  Continue topiramate  at bedtime 2.  For migraines, take ibuprofen .  Try to limit pain relievers to no more than 2 days out of week if possible 3.  Consider turmeric  once to twice daily (it may have anti-inflammatory properties to help the spine pain) 4.  Keep headache diary 5.  Follow up in 5 months
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
## 2017-07-19 ENCOUNTER — Emergency Department
Admission: EM | Admit: 2017-07-19 | Discharge: 2017-07-19 | Disposition: A | Discharge status: Home / Self Care | Payer: Medicaid Other | Attending: Emergency Medicine | Admitting: Emergency Medicine

## 2017-07-19 ENCOUNTER — Other Ambulatory Visit: Payer: Self-pay

## 2017-07-19 DIAGNOSIS — R102 Pelvic and perineal pain: Secondary | ICD-10-CM | POA: Diagnosis present

## 2017-07-19 DIAGNOSIS — Z79899 Other long term (current) drug therapy: Secondary | ICD-10-CM | POA: Insufficient documentation

## 2017-07-19 DIAGNOSIS — I10 Essential (primary) hypertension: Secondary | ICD-10-CM | POA: Diagnosis not present

## 2017-07-19 DIAGNOSIS — Z711 Person with feared health complaint in whom no diagnosis is made: Secondary | ICD-10-CM

## 2017-07-19 DIAGNOSIS — Z87891 Personal history of nicotine dependence: Secondary | ICD-10-CM | POA: Diagnosis not present

## 2017-07-19 LAB — POCT PREGNANCY, URINE: Preg Test, Ur: NEGATIVE

## 2017-07-19 LAB — HCG, QUANTITATIVE, PREGNANCY: hCG, Beta Chain, Quant, S: <1 m[IU]/mL (ref <5)

## 2017-07-19 NOTE — ED Notes (Signed)
First Nurse Note: Pt c/o lower abd cramps, pt states that it feels like menstrual cramps but she is not bleeding. Pt states that she missed her last cycle but home pregnancy test came back negative. Pt is in NAD at this time.

## 2017-07-19 NOTE — ED Notes (Addendum)
Pt states last menstrual cycle was 4/18. Pt states severe cramping early today for approx 45 that felt like labor pain. Pt states dull cramping pain at this time.

## 2017-07-19 NOTE — ED Notes (Signed)
Pt verbalized understanding of discharge instructions. NAD at this time. 

## 2017-07-19 NOTE — ED Triage Notes (Addendum)
C/o "menstrual type cramps" for approx 45 minutes today. States she got relief with lying down. 2 weeks late on period. Pt alert and oriented X4, active, cooperative, pt in NAD. RR even and unlabored, color WNL.

## 2017-07-19 NOTE — ED Provider Notes (Signed)
[facility] Emergency Department Provider Note  ____________________________________________  Time seen: Approximately 4:12 PM  I have reviewed the triage vital signs and the nursing notes.   HISTORY  Chief Complaint Late Period    HPI Melinda Mendoza is a 32 y.o. female with a history of bipolar 1 disorder and chronic pain, presents to the emergency department with concern for menstrual cramp type pelvic discomfort.  Patient reports that she is approximately 2 weeks late and is concerned for the possibility of pregnancy.  Patient has had no changes in vaginal discharge.  She denies dysuria, hematuria, increased urinary frequency, flank pain or dyspareunia.  Patient reports that she is only experiencing mild cramping now.  No fever or chills.  No alleviating measures have been attempted.   Past Medical History:  Diagnosis Date  . Bipolar 1 disorder (HCC)   . Chronic pain   . Hypertension   . Irregular heartbeat   . Migraine   . Sleep apnea     There are no active problems to display for this patient.   Past Surgical History:  Procedure Laterality Date  . CHOLECYSTECTOMY    . laproscopy for IUD removal      Prior to Admission medications   Medication Sig Start Date End Date Taking? Authorizing Provider  cetirizine (ZYRTEC) 10 MG tablet Take 10 mg by mouth daily. 05/29/17   [provider]  chlorpheniramine-HYDROcodone (TUSSIONEX PENNKINETIC ER) 10-8 MG/5ML SUER Take 5 mLs by mouth every 12 (twelve) hours as needed. Patient not taking: Reported on 06/26/2017 05/09/17   Tommie Samsook, Jayce G, DO  clonazePAM (KLONOPIN) 0.5 MG tablet Take 1 tablet (0.5 mg total) by mouth daily. 10/18/16 10/25/16  Minna AntisPaduchowski, Kevin, MD  DULoxetine (CYMBALTA) 30 MG capsule Take 30 mg by mouth daily. 11/14/16 11/14/17  [provider]  lamoTRIgine (LAMICTAL) 100 MG tablet Take 100 mg by mouth 2 (two) times daily.    [provider]  norethindrone-ethinyl estradiol  (JUNEL FE,GILDESS FE,LOESTRIN FE) 1-20 MG-MCG tablet Take 1 tablet by mouth daily. 03/11/17 03/11/18  [provider]  predniSONE (DELTASONE) 50 MG tablet 1 tablet daily x 5 days. Patient not taking: Reported on 06/26/2017 05/09/17   Tommie Samsook, Jayce G, DO  propranolol ER (INDERAL LA) 60 MG 24 hr capsule Take 60 mg by mouth daily. 11/14/16 11/14/17  [provider]  SUMAtriptan (IMITREX) 100 MG tablet Take 1 tablet earliest onset of migraine.  May repeat once in 2 hours if headache persists or recurs.  Do not exceed 2 tablets in 24 hours. 03/18/17   Drema DallasJaffe, Adam R, DO  tiZANidine (ZANAFLEX) 4 MG capsule Take 4 mg by mouth 3 (three) times daily.    [provider]  topiramate (TOPAMAX) 100 MG tablet TAKE 2 TABLETS BY MOUTH AT BEDTIME 06/18/17   Drema DallasJaffe, Adam R, DO    Allergies Patient has no known allergies.  Family History  Problem Relation Age of Onset  . Heart disease Mother   . Diabetes Mother   . Heart disease Maternal Grandmother   . Diverticulitis Maternal Grandmother   . Kidney failure Father   . Gestational diabetes Sister     Social History Social History   Tobacco Use  . Smoking status: Former Smoker    Last attempt to quit: 10/19/2011    Years since quitting: 5.7  . Smokeless tobacco: Never Used  Substance Use Topics  . Alcohol use: Yes    Comment: rarely- once per year  . Drug use: No  Review of Systems  Constitutional: No fever/chills Eyes: No visual changes. No discharge ENT: No upper respiratory complaints. Cardiovascular: no chest pain. Respiratory: no cough. No SOB. Gastrointestinal: Patient has pelvic cramping. No nausea, no vomiting.  No diarrhea.  No constipation. Genitourinary: Negative for dysuria. No hematuria Musculoskeletal: Negative for musculoskeletal pain. Skin: Negative for rash, abrasions, lacerations, ecchymosis. Neurological: Negative for headaches, focal weakness or  numbness.   ____________________________________________   PHYSICAL EXAM:  VITAL SIGNS: ED Triage Vitals  Enc Vitals Group     BP 07/19/17 1442 138/86     Pulse Rate 07/19/17 1442 62     Resp 07/19/17 1442 18     Temp 07/19/17 1442 97.9 F (36.6 C)     Temp Source 07/19/17 1442 Oral     SpO2 07/19/17 1442 100 %     Weight 07/19/17 1443 245 lb (111.1 kg)     Height --      Head Circumference --      Peak Flow --      Pain Score 07/19/17 1443 2     Pain Loc --      Pain Edu? --      Excl. in GC? --      Constitutional: Alert and oriented. Well appearing and in no acute distress. Eyes: Conjunctivae are normal. PERRL. EOMI. Head: Atraumatic. Cardiovascular: Normal rate, regular rhythm. Normal S1 and S2.  Good peripheral circulation. Respiratory: Normal respiratory effort without tachypnea or retractions. Lungs CTAB. Good air entry to the bases with no decreased or absent breath sounds. Gastrointestinal: Bowel sounds 4 quadrants. Soft and nontender to palpation. No guarding or rigidity. No palpable masses. No distention. No CVA tenderness. Musculoskeletal: Full range of motion to all extremities. No gross deformities appreciated. Neurologic:  Normal speech and language. No gross focal neurologic deficits are appreciated.  Skin:  Skin is warm, dry and intact. No rash noted. Psychiatric: Mood and affect are normal. Speech and behavior are normal. Patient exhibits appropriate insight and judgement.   ____________________________________________   LABS (all labs ordered are listed, but only abnormal results are displayed)  Labs Reviewed  HCG, QUANTITATIVE, PREGNANCY  POC URINE PREG, ED  POCT PREGNANCY, URINE   ____________________________________________  EKG   ____________________________________________  RADIOLOGY   No results found.  ____________________________________________    PROCEDURES  Procedure(s) performed:    Procedures    Medications -  No data to display   ____________________________________________   INITIAL IMPRESSION / ASSESSMENT AND PLAN / ED COURSE  Pertinent labs & imaging results that were available during my care of the patient were reviewed by me and considered in my medical decision making (see chart for details).  Review of the [place] CSRS was performed in accordance of the [facility] prior to dispensing any controlled drugs.  Clinical Course as of Jul 20 1615  Fri Jul 19, 2017  1535 Preg Test, Ur: NEGATIVE [JW]    Clinical Course User Index [JW] Orvil Feil, PA-C    Assessment and plan Feared complaint without diagnosis.  Patient presented to the emergency department with concern for pregnancy.  Patient's beta hCG was less than 1 in the emergency department.  I offered to conduct a pelvic ultrasound in the emergency department and patient declined stating "I have an appointment with my OB/GYN in 2 weeks."  Tylenol and ibuprofen alternating for menstrual cramps were recommended.  Patient was advised to follow-up with her primary care provider.   ____________________________________________  FINAL CLINICAL IMPRESSION(S) / ED DIAGNOSES  Final diagnoses:  Feared complaint without diagnosis      NEW MEDICATIONS STARTED DURING THIS VISIT:  ED Discharge Orders    None          This chart was dictated using voice recognition software/Dragon. Despite best efforts to proofread, errors can occur which can change the meaning. Any change was purely unintentional.    Orvil Feil, PA-C 07/19/17 1617    Sharyn Creamer, MD 07/20/17 [phone]

## 2017-08-16 IMAGING — CR DG SHOULDER 2+V*R*
3 series · 3 of 3 positions shown · non-contrast
Comparison: None.

CLINICAL DATA: Pain following fall

EXAM:
RIGHT SHOULDER - 2+ VIEW

[shoulder grashey]
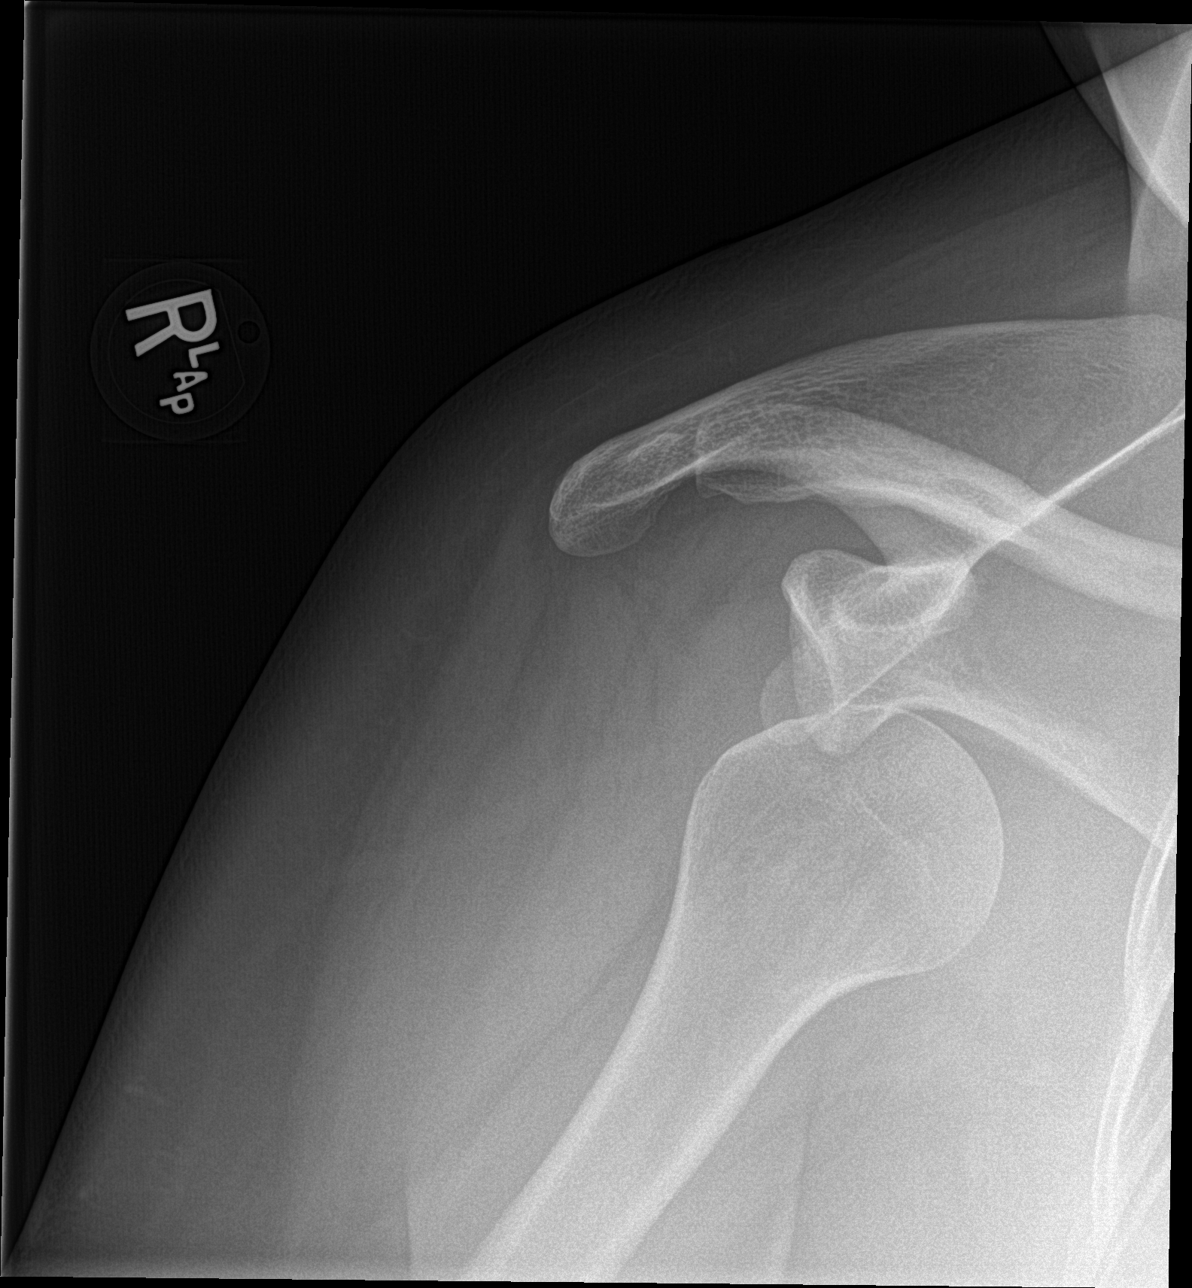

[shoulder y view (1 of 2)]
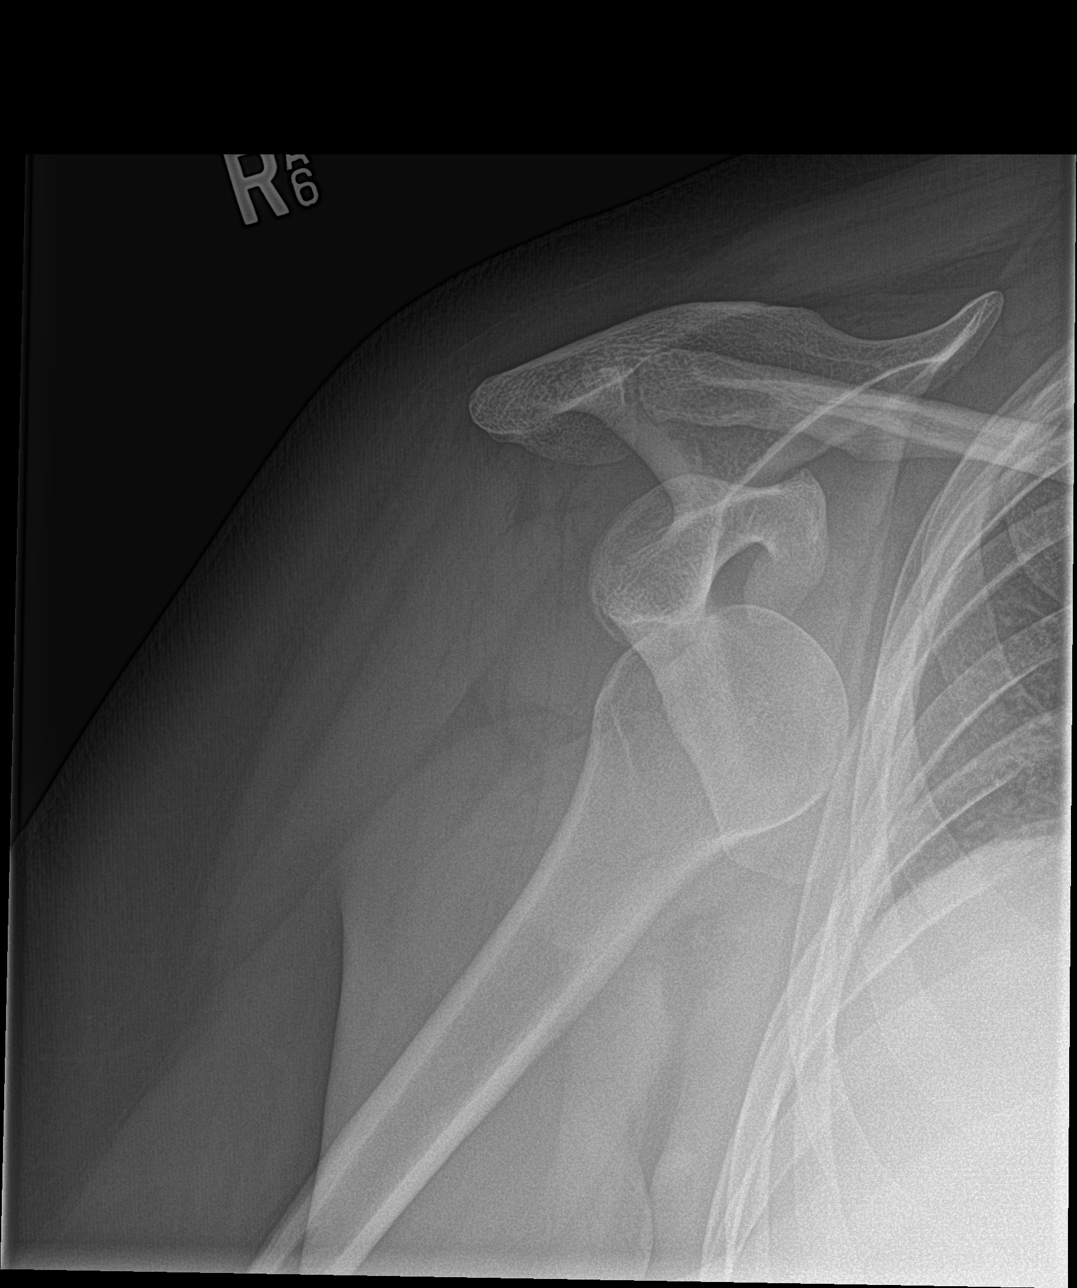

[shoulder y view (2 of 2)]
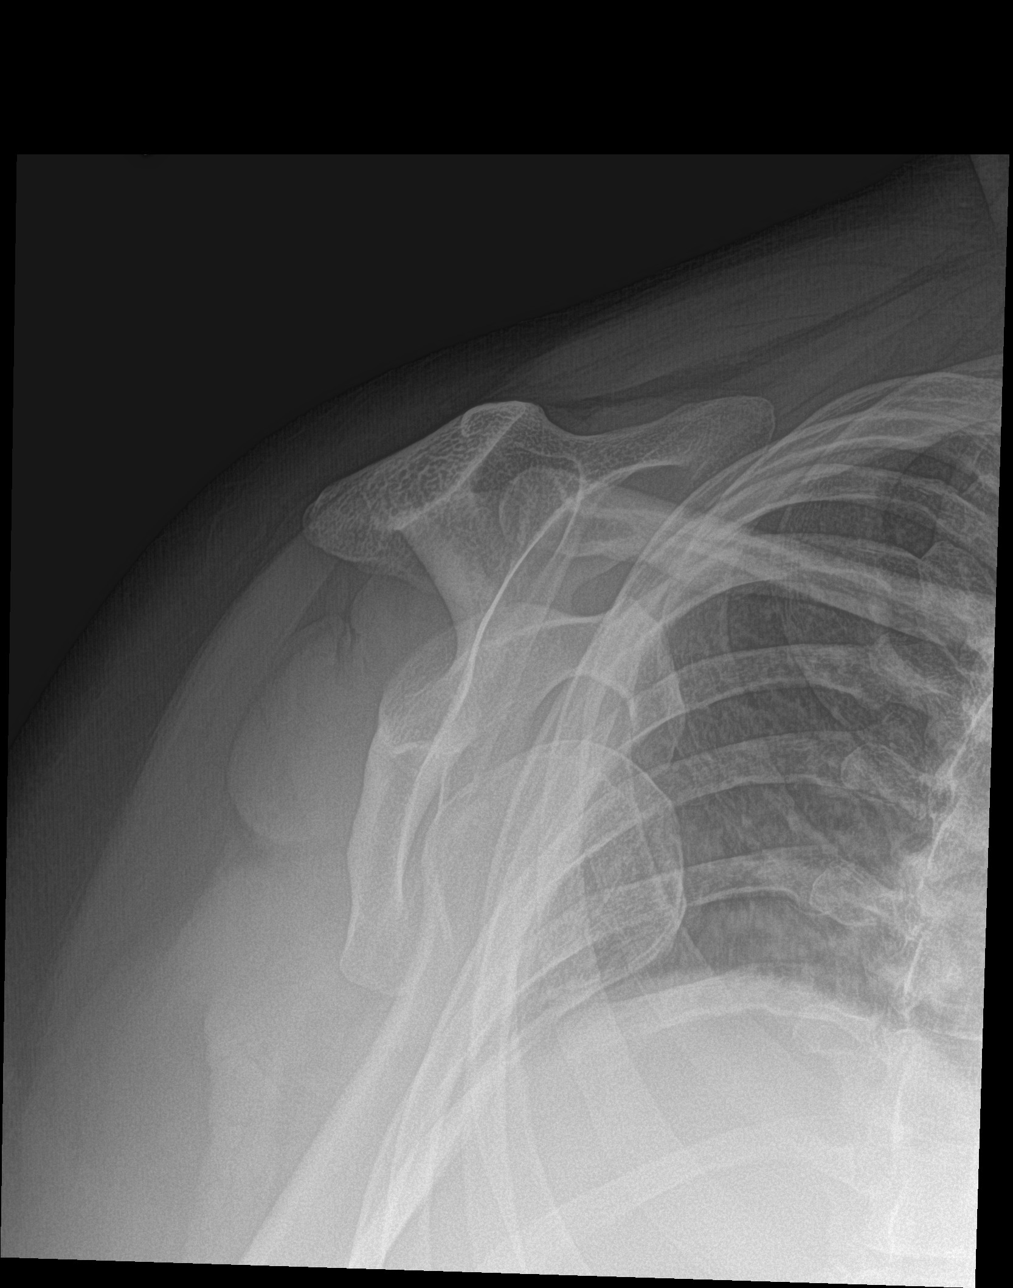

[3 of 3 positions shown; findings below may reference images not displayed]

FINDINGS: Oblique, frontal, and Y scapular images were obtained. There is a
subcoracoid anterior dislocation. Humeral head appears impacted
along the inferior glenoid. No fracture evident. The joint spaces
appear unremarkable.
IMPRESSION: Anterior dislocation. No fracture evident. The humeral head appears
impacted along the inferior glenoid.

## 2017-08-27 DIAGNOSIS — N9489 Other specified conditions associated with female genital organs and menstrual cycle: Secondary | ICD-10-CM | POA: Insufficient documentation

## 2017-08-27 DIAGNOSIS — N3946 Mixed incontinence: Secondary | ICD-10-CM | POA: Insufficient documentation
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
## 2017-11-26 ENCOUNTER — Ambulatory Visit: Payer: Self-pay | Admitting: Neurology

## 2017-12-12 ENCOUNTER — Other Ambulatory Visit: Payer: Self-pay | Admitting: Neurology

## 2018-03-20 ENCOUNTER — Ambulatory Visit
Admission: EM | Admit: 2018-03-20 | Discharge: 2018-03-20 | Disposition: A | Discharge status: Home / Self Care | Payer: Medicaid Other | Attending: Family Medicine | Admitting: Family Medicine

## 2018-03-20 ENCOUNTER — Encounter: Payer: Self-pay | Admitting: Emergency Medicine

## 2018-03-20 ENCOUNTER — Other Ambulatory Visit: Payer: Self-pay

## 2018-03-20 DIAGNOSIS — S0101XA Laceration without foreign body of scalp, initial encounter: Secondary | ICD-10-CM

## 2018-03-20 DIAGNOSIS — Z23 Encounter for immunization: Secondary | ICD-10-CM

## 2018-03-20 DIAGNOSIS — W268XXA Contact with other sharp object(s), not elsewhere classified, initial encounter: Secondary | ICD-10-CM

## 2018-03-20 MED ORDER — TETANUS-DIPHTH-ACELL PERTUSSIS 5-2.5-18.5 LF-MCG/0.5 IM SUSP
0.5000 mL | Freq: Once | INTRAMUSCULAR | Status: AC
  Administered 2018-03-20: 0.5 mL via INTRAMUSCULAR

## 2018-03-20 NOTE — ED Triage Notes (Signed)
Patient c/o laceration to her forward this morning. She cut it on a piece of tin.

## 2018-03-20 NOTE — ED Provider Notes (Signed)
[facility]    CSN: [id number] Arrival date & time: 03/20/18  1050     History   Chief Complaint Chief Complaint  Patient presents with  . Head Laceration    HPI Melinda Mendoza is a 33 y.o. female.   HPI  -year-old female presents with a laceration to her forehead near the scalp line.  She states that happened this morning when she ran into a piece of tin while trying to corral a rooster.  She her last tetanus around 5 years ago.        Past Medical History:  Diagnosis Date  . Bipolar 1 disorder (HCC)   . Chronic pain   . Hypertension   . Irregular heartbeat   . Migraine   . Sleep apnea     There are no active problems to display for this patient.   Past Surgical History:  Procedure Laterality Date  . CHOLECYSTECTOMY    . laproscopy for IUD removal      OB History   No obstetric history on file.      Home Medications    Prior to Admission medications   Medication Sig Start Date End Date Taking? Authorizing Provider  clonazePAM (KLONOPIN) 0.5 MG tablet Take 1 tablet (0.5 mg total) by mouth daily. 10/18/16 03/20/18 Yes Minna Antis, MD  lamoTRIgine (LAMICTAL) 100 MG tablet Take 100 mg by mouth 2 (two) times daily.   Yes [provider]  norethindrone-ethinyl estradiol (JUNEL FE,GILDESS FE,LOESTRIN FE) 1-20 MG-MCG tablet Take 1 tablet by mouth daily. 03/11/17 03/20/18 Yes [provider]  predniSONE (DELTASONE) 50 MG tablet 1 tablet daily x 5 days. 05/09/17  Yes Cook, Jayce G, DO  propranolol ER (INDERAL LA) 60 MG 24 hr capsule Take 60 mg by mouth daily. 11/14/16 03/20/18 Yes [provider]  SUMAtriptan (IMITREX) 100 MG tablet Take 1 tablet earliest onset of migraine.  May repeat once in 2 hours if headache persists or recurs.  Do not exceed 2 tablets in 24 hours. 03/18/17  Yes Jaffe, Adam R, DO  tiZANidine (ZANAFLEX) 4 MG capsule Take 4 mg by mouth 3 (three) times daily.   Yes [provider]  topiramate (TOPAMAX)  100 MG tablet TAKE 2 TABLETS BY MOUTH AT BEDTIME 12/13/17  Yes Drema Dallas, DO    Family History Family History  Problem Relation Age of Onset  . Heart disease Mother   . Diabetes Mother   . Heart disease Maternal Grandmother   . Diverticulitis Maternal Grandmother   . Kidney failure Father   . Gestational diabetes Sister     Social History Social History   Tobacco Use  . Smoking status: Former Smoker    Last attempt to quit: 10/19/2011    Years since quitting: 6.4  . Smokeless tobacco: Never Used  Substance Use Topics  . Alcohol use: Yes    Comment: rarely- once per year  . Drug use: No     Allergies   Patient has no known allergies.   Review of Systems Review of Systems  Constitutional: Positive for activity change. Negative for appetite change, chills, fatigue and fever.  Skin: Positive for wound.  All other systems reviewed and are negative.    Physical Exam Triage Vital Signs ED Triage Vitals  Enc Vitals Group     BP 03/20/18 1105 (!) 133/100     Pulse Rate 03/20/18 1105 (!) 53     Resp 03/20/18 1105 18     Temp 03/20/18 1105  98.1 F (36.7 C)     Temp Source 03/20/18 1105 Oral     SpO2 03/20/18 1105 100 %     Weight 03/20/18 1103 226 lb (102.5 kg)     Height 03/20/18 1103 5\' 3"  (1.6 m)     Head Circumference --      Peak Flow --      Pain Score 03/20/18 1102 4     Pain Loc --      Pain Edu? --      Excl. in [place]? --    No data found.  Updated Vital Signs BP (!) 133/100 (BP Location: Right Arm)   Pulse (!) 53   Temp 98.1 F (36.7 C) (Oral)   Resp 18   Ht 5\' 3"  (1.6 m)   Wt 226 lb (102.5 kg)   LMP 02/27/2018   SpO2 100%   BMI 40.03 kg/m   Visual Acuity Right Eye Distance:   Left Eye Distance:   Bilateral Distance:    Right Eye Near:   Left Eye Near:    Bilateral Near:     Physical Exam Vitals signs and nursing note reviewed.  Constitutional:      General: She is not in acute distress.    Appearance: Normal appearance. She is  obese. She is not ill-appearing, toxic-appearing or diaphoretic.  HENT:     Head: Normocephalic.     Nose: Nose normal.     Mouth/Throat:     Mouth: Mucous membranes are moist.  Eyes:     General:        Right eye: No discharge.        Left eye: No discharge.     Conjunctiva/sclera: Conjunctivae normal.  Neck:     Musculoskeletal: Normal range of motion and neck supple.  Musculoskeletal: Normal range of motion.  Skin:    General: Skin is warm and dry.     Comments: Obliquely oriented facial laceration ending approximately 5 cm in length.  It is just barely into the subcutaneous tissuemore towards the midline extends at the top of the forehead just barely into the scalp line..  Neurological:     General: No focal deficit present.     Mental Status: She is alert and oriented to person, place, and time.  Psychiatric:        Mood and Affect: Mood normal.        Behavior: Behavior normal.        Thought Content: Thought content normal.        Judgment: Judgment normal.      UC Treatments / Results  Labs (all labs ordered are listed, but only abnormal results are displayed) Labs Reviewed - No data to display  EKG None  Radiology No results found.  Procedures Laceration Repair Date/Time: 03/20/2018 12:13 PM Performed by: Lutricia Feil, PA-C Authorized by: Tommie Sams, DO   Consent:    Consent obtained:  Verbal   Consent given by:  Patient   Risks discussed:  Infection Anesthesia (see MAR for exact dosages):    Anesthesia method:  None Laceration details:    Location:  Scalp   Length (cm):  5   Depth (mm):  1 Repair type:    Repair type:  Simple Exploration:    Hemostasis achieved with:  Direct pressure   Wound exploration: entire depth of wound probed and visualized     Contaminated: no   Treatment:    Area cleansed with:  Shur-Clens  Amount of cleaning:  Standard   Irrigation solution:  Tap water   Irrigation method:  Tap   Visualized foreign  bodies/material removed: no   Skin repair:    Repair method:  Tissue adhesive Approximation:    Approximation:  Close Post-procedure details:    Dressing:  Open (no dressing)   Patient tolerance of procedure:  Tolerated well, no immediate complications   (including critical care time)  Medications Ordered in UC Medications  Tdap (BOOSTRIX) injection 0.5 mL (0.5 mLs Intramuscular Given 03/20/18 1114)    Initial Impression / Assessment and Plan / UC Course  I have reviewed the triage vital signs and the nursing notes.  Pertinent labs & imaging results that were available during my care of the patient were reviewed by me and considered in my medical decision making (see chart for details).   Patient not to try to peel the Dermabond off until it falls off of its own accord.  ReViewed signs and symptoms of infection.  Patient return for any of these that occur.   Final Clinical Impressions(s) / UC Diagnoses   Final diagnoses:  Laceration of scalp without foreign body, initial encounter   Discharge Instructions   None    ED Prescriptions    None     Controlled Substance Prescriptions [place] Controlled Substance Registry consulted? Not Applicable   Lutricia FeilRoemer, Jovane Foutz P, PA-C 03/20/18 1218

## 2018-07-19 ENCOUNTER — Encounter: Payer: Self-pay | Admitting: Emergency Medicine

## 2018-07-19 ENCOUNTER — Emergency Department
Admission: EM | Admit: 2018-07-19 | Discharge: 2018-07-19 | Disposition: A | Discharge status: Home / Self Care | Payer: Medicaid Other | Attending: Emergency Medicine | Admitting: Emergency Medicine

## 2018-07-19 ENCOUNTER — Other Ambulatory Visit: Payer: Self-pay

## 2018-07-19 DIAGNOSIS — L03115 Cellulitis of right lower limb: Secondary | ICD-10-CM | POA: Diagnosis not present

## 2018-07-19 DIAGNOSIS — M79661 Pain in right lower leg: Secondary | ICD-10-CM | POA: Diagnosis present

## 2018-07-19 DIAGNOSIS — Z87891 Personal history of nicotine dependence: Secondary | ICD-10-CM | POA: Diagnosis not present

## 2018-07-19 DIAGNOSIS — Z79899 Other long term (current) drug therapy: Secondary | ICD-10-CM | POA: Diagnosis not present

## 2018-07-19 DIAGNOSIS — I10 Essential (primary) hypertension: Secondary | ICD-10-CM | POA: Insufficient documentation

## 2018-07-19 MED ORDER — SULFAMETHOXAZOLE-TRIMETHOPRIM 800-160 MG PO TABS: 1.0000 | ORAL_TABLET | Freq: Two times a day (BID) | ORAL | 0 refills | Status: DC

## 2018-07-19 MED ORDER — MELOXICAM 15 MG PO TABS: 15.0000 mg | ORAL_TABLET | Freq: Every day | ORAL | 0 refills | Status: DC

## 2018-07-19 MED ORDER — SULFAMETHOXAZOLE-TRIMETHOPRIM 800-160 MG PO TABS
1.0000 | ORAL_TABLET | Freq: Once | ORAL | Status: AC
  Administered 2018-07-19: 1 via ORAL
  Filled 2018-07-19: qty 1

## 2018-07-19 MED ORDER — MELOXICAM 7.5 MG PO TABS
15.0000 mg | ORAL_TABLET | Freq: Once | ORAL | Status: AC
  Administered 2018-07-19: 15 mg via ORAL
  Filled 2018-07-19: qty 2

## 2018-07-19 NOTE — ED Provider Notes (Signed)
[facility] Emergency Department Provider Note  ____________________________________________  Time seen: Approximately 7:19 PM  I have reviewed the triage vital signs and the nursing notes.   HISTORY  Chief Complaint Leg Pain    HPI Melinda Mendoza is a 33 y.o. female who presents emergency department for a painful "lump" to the anterior aspect over the ankle.  Patient reports that she first noticed the area this morning when crossing her legs.  Patient denies any known injury.  She does report that the area is erythematous.  No other injury or complaint.  No medications for his complaint prior to arrival.         Past Medical History:  Diagnosis Date  . Bipolar 1 disorder (HCC)   . Chronic pain   . Hypertension   . Irregular heartbeat   . Migraine   . Sleep apnea     There are no active problems to display for this patient.   Past Surgical History:  Procedure Laterality Date  . CHOLECYSTECTOMY    . laproscopy for IUD removal      Prior to Admission medications   Medication Sig Start Date End Date Taking? Authorizing Provider  clonazePAM (KLONOPIN) 0.5 MG tablet Take 1 tablet (0.5 mg total) by mouth daily. 10/18/16 03/20/18  Minna AntisPaduchowski, Kevin, MD  lamoTRIgine (LAMICTAL) 100 MG tablet Take 100 mg by mouth 2 (two) times daily.    [provider]  meloxicam (MOBIC) 15 MG tablet Take 1 tablet (15 mg total) by mouth daily. 07/19/18   Cuthriell, Delorise RoyalsJonathan D, PA-C  norethindrone-ethinyl estradiol (JUNEL FE,GILDESS FE,LOESTRIN FE) 1-20 MG-MCG tablet Take 1 tablet by mouth daily. 03/11/17 03/20/18  [provider]  predniSONE (DELTASONE) 50 MG tablet 1 tablet daily x 5 days. 05/09/17   Tommie Samsook, Jayce G, DO  propranolol ER (INDERAL LA) 60 MG 24 hr capsule Take 60 mg by mouth daily. 11/14/16 03/20/18  [provider]  sulfamethoxazole-trimethoprim (BACTRIM DS) 800-160 MG tablet Take 1 tablet by mouth 2 (two) times daily. 07/19/18   Cuthriell,  Delorise RoyalsJonathan D, PA-C  SUMAtriptan (IMITREX) 100 MG tablet Take 1 tablet earliest onset of migraine.  May repeat once in 2 hours if headache persists or recurs.  Do not exceed 2 tablets in 24 hours. 03/18/17   Drema DallasJaffe, Adam R, DO  tiZANidine (ZANAFLEX) 4 MG capsule Take 4 mg by mouth 3 (three) times daily.    [provider]  topiramate (TOPAMAX) 100 MG tablet TAKE 2 TABLETS BY MOUTH AT BEDTIME 12/13/17   Drema DallasJaffe, Adam R, DO    Allergies Patient has no known allergies.  Family History  Problem Relation Age of Onset  . Heart disease Mother   . Diabetes Mother   . Heart disease Maternal Grandmother   . Diverticulitis Maternal Grandmother   . Kidney failure Father   . Gestational diabetes Sister     Social History Social History   Tobacco Use  . Smoking status: Former Smoker    Last attempt to quit: 10/19/2011    Years since quitting: 6.7  . Smokeless tobacco: Never Used  Substance Use Topics  . Alcohol use: Yes    Comment: rarely- once per year  . Drug use: No     Review of Systems  Constitutional: No fever/chills Eyes: No visual changes. No discharge ENT: No upper respiratory complaints. Cardiovascular: no chest pain. Respiratory: no cough. No SOB. Gastrointestinal: No abdominal pain.  No nausea, no vomiting.  No diarrhea.  No constipation. Musculoskeletal: Positive for  painful, erythematous lesion to the anterior ankle right ankle. Skin: Negative for rash, abrasions, lacerations, ecchymosis. Neurological: Negative for headaches, focal weakness or numbness. 10-point ROS otherwise negative.  ____________________________________________   PHYSICAL EXAM:  VITAL SIGNS: ED Triage Vitals  Enc Vitals Group     BP 07/19/18 1822 (!) 148/90     Pulse Rate 07/19/18 1822 71     Resp 07/19/18 1822 16     Temp 07/19/18 1822 98.3 F (36.8 C)     Temp Source 07/19/18 1822 Oral     SpO2 07/19/18 1822 99 %     Weight 07/19/18 1821 225 lb 15.5 oz (102.5 kg)     Height --       Head Circumference --      Peak Flow --      Pain Score 07/19/18 1820 6     Pain Loc --      Pain Edu? --      Excl. in [place]? --      Constitutional: Alert and oriented. Well appearing and in no acute distress. Eyes: Conjunctivae are normal. PERRL. EOMI. Head: Atraumatic. ENT:      Ears:       Nose: No congestion/rhinnorhea.      Mouth/Throat: Mucous membranes are moist.  Neck: No stridor.    Cardiovascular: Normal rate, regular rhythm. Normal S1 and S2.  Good peripheral circulation. Respiratory: Normal respiratory effort without tachypnea or retractions. Lungs CTAB. Good air entry to the bases with no decreased or absent breath sounds. Musculoskeletal: Full range of motion to all extremities. No gross deformities appreciated.  Visualization of the right anterior ankle reveals minimal erythema and mildly raised lesion measuring approximately 2 cm in length.  No fluctuance or induration.  Area is tender to palpation.  No other palpable abnormality to the ankle.  No edema of ankle joint itself.  No streaking.  Dorsalis pedis pulse and sensation intact distally. Neurologic:  Normal speech and language. No gross focal neurologic deficits are appreciated.  Skin:  Skin is warm, dry and intact. No rash noted. Psychiatric: Mood and affect are normal. Speech and behavior are normal. Patient exhibits appropriate insight and judgement.   ____________________________________________   LABS (all labs ordered are listed, but only abnormal results are displayed)  Labs Reviewed - No data to display ____________________________________________  EKG   ____________________________________________  RADIOLOGY   No results found.  ____________________________________________    PROCEDURES  Procedure(s) performed:    Procedures    Medications  sulfamethoxazole-trimethoprim (BACTRIM DS) 800-160 MG per tablet 1 tablet (has no administration in time range)  meloxicam (MOBIC) tablet 15  mg (has no administration in time range)     ____________________________________________   INITIAL IMPRESSION / ASSESSMENT AND PLAN / ED COURSE  Pertinent labs & imaging results that were available during my care of the patient were reviewed by me and considered in my medical decision making (see chart for details).  Review of the [place] CSRS was performed in accordance of the [place] prior to dispensing any controlled drugs.           Patient's diagnosis is consistent with cellulitis of the ankle.  Patient presented to the emergency department complaining of a painful lesion to the anterior ankle.  On exam, it appears that area is mild cellulitis versus infected insect bite.  Differential included infected lipoma/sebaceous cyst, cellulitis, bug bite.  Patient was placed on antibiotics and anti-inflammatory.  Follow-up primary care. Patient is given ED precautions to return to  the ED for any worsening or new symptoms.     ____________________________________________  FINAL CLINICAL IMPRESSION(S) / ED DIAGNOSES  Final diagnoses:  Cellulitis of right lower extremity      NEW MEDICATIONS STARTED DURING THIS VISIT:  ED Discharge Orders         Ordered    sulfamethoxazole-trimethoprim (BACTRIM DS) 800-160 MG tablet  2 times daily     07/19/18 1924    meloxicam (MOBIC) 15 MG tablet  Daily     07/19/18 1924              This chart was dictated using voice recognition software/Dragon. Despite best efforts to proofread, errors can occur which can change the meaning. Any change was purely unintentional.    Racheal PatchesCuthriell, Jonathan D, PA-C 07/19/18 1931    Arnaldo NatalMalinda, Paul F, MD 07/20/18 Jacinta [id number]

## 2018-07-19 NOTE — ED Triage Notes (Signed)
Painful "lump" to right lower leg / ankle. Noticed today.  Denies injury.

## 2018-09-20 ENCOUNTER — Encounter: Payer: Self-pay | Admitting: Emergency Medicine

## 2018-09-20 ENCOUNTER — Other Ambulatory Visit: Payer: Self-pay

## 2018-09-20 ENCOUNTER — Emergency Department
Admission: EM | Admit: 2018-09-20 | Discharge: 2018-09-20 | Disposition: A | Discharge status: Home / Self Care | Payer: Medicaid Other | Attending: Emergency Medicine | Admitting: Emergency Medicine

## 2018-09-20 ENCOUNTER — Emergency Department: Payer: Medicaid Other

## 2018-09-20 DIAGNOSIS — I10 Essential (primary) hypertension: Secondary | ICD-10-CM | POA: Diagnosis not present

## 2018-09-20 DIAGNOSIS — M5441 Lumbago with sciatica, right side: Secondary | ICD-10-CM | POA: Diagnosis not present

## 2018-09-20 DIAGNOSIS — Z87891 Personal history of nicotine dependence: Secondary | ICD-10-CM | POA: Insufficient documentation

## 2018-09-20 DIAGNOSIS — M545 Low back pain: Secondary | ICD-10-CM | POA: Diagnosis present

## 2018-09-20 LAB — URINALYSIS, COMPLETE (UACMP) WITH MICROSCOPIC
Bacteria, UA: NONE SEEN
Bilirubin Urine: NEGATIVE
Glucose, UA: NEGATIVE mg/dL
Hgb urine dipstick: NEGATIVE
Ketones, ur: NEGATIVE mg/dL
Nitrite: NEGATIVE
Protein, ur: NEGATIVE mg/dL
Specific Gravity, Urine: 1.018 (ref 1.005–1.030)
pH: 5 (ref 5.0–8.0)

## 2018-09-20 LAB — POCT PREGNANCY, URINE: Preg Test, Ur: NEGATIVE

## 2018-09-20 MED ORDER — KETOROLAC TROMETHAMINE 30 MG/ML IJ SOLN
30.0000 mg | Freq: Once | INTRAMUSCULAR | Status: AC
  Administered 2018-09-20: 30 mg via INTRAMUSCULAR
  Filled 2018-09-20: qty 1

## 2018-09-20 MED ORDER — ONDANSETRON 4 MG PO TBDP
4.0000 mg | ORAL_TABLET | Freq: Once | ORAL | Status: AC
  Administered 2018-09-20: 4 mg via ORAL
  Filled 2018-09-20: qty 1

## 2018-09-20 MED ORDER — MELOXICAM 15 MG PO TABS: 15.0000 mg | ORAL_TABLET | Freq: Every day | ORAL | 1 refills | Status: AC

## 2018-09-20 MED ORDER — HYDROCODONE-ACETAMINOPHEN 5-325 MG PO TABS: 1.0000 | ORAL_TABLET | Freq: Four times a day (QID) | ORAL | 0 refills | Status: AC | PRN

## 2018-09-20 MED ORDER — OXYCODONE-ACETAMINOPHEN 5-325 MG PO TABS
1.0000 | ORAL_TABLET | Freq: Once | ORAL | Status: AC
  Administered 2018-09-20: 22:00:00 1 via ORAL
  Filled 2018-09-20: qty 1

## 2018-09-20 MED ORDER — METHOCARBAMOL 500 MG PO TABS: 500.0000 mg | ORAL_TABLET | Freq: Three times a day (TID) | ORAL | 0 refills | Status: AC | PRN

## 2018-09-20 NOTE — ED Triage Notes (Signed)
Pt arrives ambulatory and POV to triage with c/o back pain since this AM when she changed positions. Pt reports hx of sciatica and states that this feels the same. Pt is in NAD.

## 2018-09-21 NOTE — ED Provider Notes (Signed)
[facility] Emergency Department Provider Note  ____________________________________________  Time seen: Approximately 7:08 PM  I have reviewed the triage vital signs and the nursing notes.   HISTORY  Chief Complaint Back Pain    HPI Melinda Mendoza is a 33 y.o. female with a history of bipolar disorder and chronic pain, presents to the emergency department with 10 on a 10 low back pain that is worsened with change of position.  Patient reports that she primarily has right-sided low back pain that radiates down the right lower extremity.  She denies recent falls or mechanisms of trauma.  No dysuria, hematuria or increased urinary frequency.  She denies possibility of pregnancy.  She was recently treated with prednisone 2 weeks ago by primary care.  Patient is here for pain management.        Past Medical History:  Diagnosis Date  . Bipolar 1 disorder (HCC)   . Chronic pain   . Hypertension   . Irregular heartbeat   . Migraine   . Sleep apnea     There are no active problems to display for this patient.   Past Surgical History:  Procedure Laterality Date  . CHOLECYSTECTOMY    . laproscopy for IUD removal      Prior to Admission medications   Medication Sig Start Date End Date Taking? Authorizing Provider  clonazePAM (KLONOPIN) 0.5 MG tablet Take 1 tablet (0.5 mg total) by mouth daily. 10/18/16 03/20/18  Minna AntisPaduchowski, Kevin, MD  HYDROcodone-acetaminophen (NORCO) 5-325 MG tablet Take 1 tablet by mouth every 6 (six) hours as needed for up to 2 days. 09/20/18 09/22/18  Orvil FeilWoods, Jaclyn M, PA-C  lamoTRIgine (LAMICTAL) 100 MG tablet Take 100 mg by mouth 2 (two) times daily.    [provider]  meloxicam (MOBIC) 15 MG tablet Take 1 tablet (15 mg total) by mouth daily for 7 days. 09/20/18 09/27/18  Orvil FeilWoods, Jaclyn M, PA-C  methocarbamol (ROBAXIN) 500 MG tablet Take 1 tablet (500 mg total) by mouth every 8 (eight) hours as needed for up to 5 days. 09/20/18 09/25/18   Orvil FeilWoods, Jaclyn M, PA-C  norethindrone-ethinyl estradiol (JUNEL FE,GILDESS FE,LOESTRIN FE) 1-20 MG-MCG tablet Take 1 tablet by mouth daily. 03/11/17 03/20/18  [provider]  predniSONE (DELTASONE) 50 MG tablet 1 tablet daily x 5 days. 05/09/17   Tommie Samsook, Jayce G, DO  propranolol ER (INDERAL LA) 60 MG 24 hr capsule Take 60 mg by mouth daily. 11/14/16 03/20/18  [provider]  sulfamethoxazole-trimethoprim (BACTRIM DS) 800-160 MG tablet Take 1 tablet by mouth 2 (two) times daily. 07/19/18   Cuthriell, Delorise RoyalsJonathan D, PA-C  SUMAtriptan (IMITREX) 100 MG tablet Take 1 tablet earliest onset of migraine.  May repeat once in 2 hours if headache persists or recurs.  Do not exceed 2 tablets in 24 hours. 03/18/17   Drema DallasJaffe, Adam R, DO  tiZANidine (ZANAFLEX) 4 MG capsule Take 4 mg by mouth 3 (three) times daily.    [provider]  topiramate (TOPAMAX) 100 MG tablet TAKE 2 TABLETS BY MOUTH AT BEDTIME 12/13/17   Drema DallasJaffe, Adam R, DO    Allergies Patient has no known allergies.  Family History  Problem Relation Age of Onset  . Heart disease Mother   . Diabetes Mother   . Heart disease Maternal Grandmother   . Diverticulitis Maternal Grandmother   . Kidney failure Father   . Gestational diabetes Sister     Social History Social History   Tobacco Use  . Smoking status: Former  Smoker    Quit date: 10/19/2011    Years since quitting: 6.9  . Smokeless tobacco: Never Used  Substance Use Topics  . Alcohol use: Yes    Comment: rarely- once per year  . Drug use: No     Review of Systems  Constitutional: No fever/chills Eyes: No visual changes. No discharge ENT: No upper respiratory complaints. Cardiovascular: no chest pain. Respiratory: no cough. No SOB. Gastrointestinal: No abdominal pain.  No nausea, no vomiting.  No diarrhea.  No constipation. Genitourinary: Negative for dysuria. No hematuria Musculoskeletal: Patient has low back pain.  Skin: Negative for rash, abrasions, lacerations,  ecchymosis. Neurological: Negative for headaches, focal weakness or numbness.   ____________________________________________   PHYSICAL EXAM:  VITAL SIGNS: ED Triage Vitals [09/20/18 2034]  Enc Vitals Group     BP 123/64     Pulse Rate 89     Resp 18     Temp 98.9 F (37.2 C)     Temp Source Oral     SpO2 100 %     Weight 225 lb (102.1 kg)     Height 5\' 3"  (1.6 m)     Head Circumference      Peak Flow      Pain Score 10     Pain Loc      Pain Edu?      Excl. in [place]?      Constitutional: Alert and oriented. Well appearing and in no acute distress. Eyes: Conjunctivae are normal. PERRL. EOMI. Head: Atraumatic. Cardiovascular: Normal rate, regular rhythm. Normal S1 and S2.  Good peripheral circulation. Respiratory: Normal respiratory effort without tachypnea or retractions. Lungs CTAB. Good air entry to the bases with no decreased or absent breath sounds. Gastrointestinal: Bowel sounds 4 quadrants. Soft and nontender to palpation. No guarding or rigidity. No palpable masses. No distention. No CVA tenderness. Musculoskeletal: Full range of motion to all extremities. No gross deformities appreciated.  Patient has paraspinal muscle tenderness on the right.  Positive straight leg raise on the right. Neurologic:  Normal speech and language. No gross focal neurologic deficits are appreciated.  Skin:  Skin is warm, dry and intact. No rash noted. Psychiatric: Mood and affect are normal. Speech and behavior are normal. Patient exhibits appropriate insight and judgement.   ____________________________________________   LABS (all labs ordered are listed, but only abnormal results are displayed)  Labs Reviewed  URINALYSIS, COMPLETE (UACMP) WITH MICROSCOPIC - Abnormal; Notable for the following components:      Result Value   Color, Urine YELLOW (*)    APPearance HAZY (*)    Leukocytes,Ua TRACE (*)    All other components within normal limits  POC URINE PREG, ED  POCT PREGNANCY,  URINE   ____________________________________________  EKG   ____________________________________________  RADIOLOGY I personally viewed and evaluated these images as part of my medical decision making, as well as reviewing the written report by the radiologist.  Dg Lumbar Spine 2-3 Views  Result Date: 09/20/2018 CLINICAL DATA:  Low back pain EXAM: LUMBAR SPINE - 2-3 VIEW COMPARISON:  None. FINDINGS: There is transitional vertebral anatomy with 6 non rib-bearing lumbar type vertebral bodies. The disc heights are relatively well preserved. There is minimal facet arthrosis at the lower lumbar segments. There is a normal lumbar lordotic curvature. There is no significant anterolisthesis. Phleboliths project over the patient's right hemipelvis which are only partially visualized. IMPRESSION: Negative. Electronically Signed   By: Constance Holster M.D.   On: 09/20/2018 22:04  ____________________________________________    PROCEDURES  Procedure(s) performed:    Procedures    Medications  ketorolac (TORADOL) 30 MG/ML injection 30 mg (30 mg Intramuscular Given 09/20/18 2206)  oxyCODONE-acetaminophen (PERCOCET/ROXICET) 5-325 MG per tablet 1 tablet (1 tablet Oral Given 09/20/18 2205)  ondansetron (ZOFRAN-ODT) disintegrating tablet 4 mg (4 mg Oral Given 09/20/18 2205)     ____________________________________________   INITIAL IMPRESSION / ASSESSMENT AND PLAN / ED COURSE  Pertinent labs & imaging results that were available during my care of the patient were reviewed by me and considered in my medical decision making (see chart for details).  Review of the [place] CSRS was performed in accordance of the [facility] prior to dispensing any controlled drugs.           Assessment and plan Low back pain 33 year old female presents to the emergency department with acute on chronic low back pain.  Vital signs were reassuring in the emergency department.  Patient had paraspinal muscle tenderness  of the lumbar spine and a positive straight leg raise test on the right.  She had symmetric strength and normal sensation.  Neuro exam was otherwise reassuring and appropriate for age.  Patient was discharged with a short course of Norco, Robaxin and meloxicam.  She was advised to continue physical therapy so she can obtain MRI of her lumbar spine.  [place] drug [place] was reviewed during this emergency department encounter.  Patient was cautioned that narcotics would not be represcribed should patient return to the emergency department requesting additional narcotic medications.  She voiced understanding.  All patient questions were answered.  ____________________________________________  FINAL CLINICAL IMPRESSION(S) / ED DIAGNOSES  Final diagnoses:  Acute right-sided low back pain with right-sided sciatica      NEW MEDICATIONS STARTED DURING THIS VISIT:  ED Discharge Orders         Ordered    HYDROcodone-acetaminophen (NORCO) 5-325 MG tablet  Every 6 hours PRN     09/20/18 2312    meloxicam (MOBIC) 15 MG tablet  Daily     09/20/18 2327    methocarbamol (ROBAXIN) 500 MG tablet  Every 8 hours PRN     09/20/18 2327              This chart was dictated using voice recognition software/Dragon. Despite best efforts to proofread, errors can occur which can change the meaning. Any change was purely unintentional.    Orvil FeilWoods, Jaclyn M, PA-C 09/21/18 1914    Minna AntisPaduchowski, Kevin, MD 09/21/18 2154

## 2018-10-08 ENCOUNTER — Other Ambulatory Visit: Payer: Self-pay | Admitting: Neurology

## 2018-10-08 DIAGNOSIS — M5416 Radiculopathy, lumbar region: Secondary | ICD-10-CM

## 2018-10-18 ENCOUNTER — Ambulatory Visit
Admission: RE | Admit: 2018-10-18 | Discharge: 2018-10-18 | Disposition: A | Discharge status: Home / Self Care | Payer: Medicaid Other | Source: Ambulatory Visit | Attending: Neurology | Admitting: Neurology

## 2018-10-18 DIAGNOSIS — M5416 Radiculopathy, lumbar region: Secondary | ICD-10-CM | POA: Insufficient documentation

## 2019-02-13 DIAGNOSIS — O169 Unspecified maternal hypertension, unspecified trimester: Secondary | ICD-10-CM

## 2019-02-13 HISTORY — DX: Unspecified maternal hypertension, unspecified trimester: O16.9

## 2019-02-13 NOTE — L&D Delivery Note (Signed)
Delivery Note  First Stage: Labor onset: 0911 Augmentation: cytotec, AROM, Pitocin Analgesia /Anesthesia intrapartum: epidural AROM at 0911  Second Stage: Complete dilation at 1342 Onset of pushing at 1350 FHR second stage Cat II- variable decels  Delivery of a viable female infant on 11/23/19 at 1358 by CNM delivery of fetal head in ROA,  position with restitution to ROT. No nuchal cord;  Anterior then posterior shoulders delivered easily with gentle downward traction. Baby placed on mom's chest, and attended to by peds.  Cord double clamped after cessation of pulsation, cut by FOB.   Third Stage: Placenta delivered spontaneously intact with 3VC @ 1402 Placenta disposition: to pathology due to [facility], noted calcifications and marginal insertion.  Uterine tone Firm / bleeding scant  Periurethral abrasion, left labial abrasion and hymenal ring abrasion identified; all hemostatic without repair.   Est. Blood Loss (mL): 400  Complications: CHTN  Mom to postpartum.  Baby to Couplet care / Skin to Skin.  Newborn: Birth Weight: pending  Apgar Scores: 8/9 Feeding planned: breast

## 2019-04-22 ENCOUNTER — Other Ambulatory Visit: Payer: Self-pay

## 2019-04-22 ENCOUNTER — Ambulatory Visit
Admission: EM | Admit: 2019-04-22 | Discharge: 2019-04-22 | Disposition: A | Discharge status: Home / Self Care | Payer: Medicaid Other | Attending: Emergency Medicine | Admitting: Emergency Medicine

## 2019-04-22 ENCOUNTER — Encounter: Payer: Self-pay | Admitting: Emergency Medicine

## 2019-04-22 DIAGNOSIS — O26891 Other specified pregnancy related conditions, first trimester: Secondary | ICD-10-CM | POA: Insufficient documentation

## 2019-04-22 DIAGNOSIS — R8271 Bacteriuria: Secondary | ICD-10-CM

## 2019-04-22 DIAGNOSIS — Z833 Family history of diabetes mellitus: Secondary | ICD-10-CM | POA: Diagnosis not present

## 2019-04-22 DIAGNOSIS — Z3201 Encounter for pregnancy test, result positive: Secondary | ICD-10-CM | POA: Diagnosis not present

## 2019-04-22 DIAGNOSIS — Z3A01 Less than 8 weeks gestation of pregnancy: Secondary | ICD-10-CM

## 2019-04-22 DIAGNOSIS — Z87891 Personal history of nicotine dependence: Secondary | ICD-10-CM | POA: Insufficient documentation

## 2019-04-22 DIAGNOSIS — Z8249 Family history of ischemic heart disease and other diseases of the circulatory system: Secondary | ICD-10-CM | POA: Diagnosis not present

## 2019-04-22 DIAGNOSIS — O2390 Unspecified genitourinary tract infection in pregnancy, unspecified trimester: Secondary | ICD-10-CM

## 2019-04-22 DIAGNOSIS — R110 Nausea: Secondary | ICD-10-CM | POA: Diagnosis present

## 2019-04-22 LAB — URINALYSIS, COMPLETE (UACMP) WITH MICROSCOPIC
Bilirubin Urine: NEGATIVE
Glucose, UA: NEGATIVE mg/dL
Ketones, ur: NEGATIVE mg/dL
Nitrite: NEGATIVE
Protein, ur: NEGATIVE mg/dL
Specific Gravity, Urine: 1.025 (ref 1.005–1.030)
pH: 5.5 (ref 5.0–8.0)

## 2019-04-22 LAB — PREGNANCY, URINE: Preg Test, Ur: POSITIVE — AB

## 2019-04-22 LAB — GLUCOSE, CAPILLARY: Glucose-Capillary: 96 mg/dL (ref 70–99)

## 2019-04-22 MED ORDER — CEPHALEXIN 500 MG PO CAPS: 500.0000 mg | ORAL_CAPSULE | Freq: Three times a day (TID) | ORAL | 0 refills | Status: AC

## 2019-04-22 MED ORDER — DOXYLAMINE-PYRIDOXINE 10-10 MG PO TBEC: 2.0000 | DELAYED_RELEASE_TABLET | Freq: Every day | ORAL | 0 refills | Status: DC

## 2019-04-22 NOTE — ED Provider Notes (Addendum)
HPI  SUBJECTIVE:  Melinda Mendoza is a 34 y.o. female who presents with possible pregnancy.  States that she missed menses last month, states she is normally very regular.  LMP: 03/09/2019.  Lasted 3 days and was normal.  She reports breast tenderness nausea and "pulling muscles around the uterus".  She states that she is very thirsty.  She had a positive home pregnancy test yesterday.  No vomiting, urinary complaints, polyuria, unintentional weight loss, vaginal odor bleeding, new or different vaginal discharge, abdominal pelvic pain.  No change in her baseline back pain.  She has a past medical history of migraines and discontinued her propanolol recently as she is not sure about safety in pregnancy.  She has a history of gestational hypertension, preeclampsia, allergies, asthma. G3P2. No history of diabetes.  EGB:TDVVOH, Marylin Crosby, MD   Past Medical History:  Diagnosis Date  . Bipolar 1 disorder (HCC)   . Chronic pain   . Hypertension   . Irregular heartbeat   . Migraine   . Sleep apnea     Past Surgical History:  Procedure Laterality Date  . CHOLECYSTECTOMY    . laproscopy for IUD removal      Family History  Problem Relation Age of Onset  . Heart disease Mother   . Diabetes Mother   . Heart disease Maternal Grandmother   . Diverticulitis Maternal Grandmother   . Kidney failure Father   . Gestational diabetes Sister     Social History   Tobacco Use  . Smoking status: Former Smoker    Quit date: 10/19/2011    Years since quitting: 7.5  . Smokeless tobacco: Never Used  Substance Use Topics  . Alcohol use: Yes    Comment: rarely- once per year  . Drug use: No    No current facility-administered medications for this encounter.  Current Outpatient Medications:  .  cephALEXin (KEFLEX) 500 MG capsule, Take 1 capsule (500 mg total) by mouth 3 (three) times daily for 5 days., Disp: 15 capsule, Rfl: 0 .  Doxylamine-Pyridoxine 10-10 MG TBEC, Take 2 tablets by mouth at bedtime.  Start 2 tabs at night.  If symptoms persist after 2 days, increase to 1 tab in the morning and 2 tabs at night.  May increase to 1 tab in the morning, 1 tab in the afternoon and 2 tabs at night., Disp: 60 tablet, Rfl: 0  No Known Allergies   ROS  As noted in HPI.   Physical Exam  BP 137/82 (BP Location: Left Arm)   Pulse 77   Temp 98.4 F (36.9 C) (Oral)   Resp 18   Ht 5\' 3"  (1.6 m)   Wt 106.6 kg   LMP 03/09/2019   SpO2 99%   BMI 41.63 kg/m   Constitutional: Well developed, well nourished, no acute distress Eyes:  EOMI, conjunctiva normal bilaterally HENT: Normocephalic, atraumatic,mucus membranes moist Respiratory: Normal inspiratory effort Cardiovascular: Normal rate GI: nondistended skin: No rash, skin intact Musculoskeletal: no deformities Neurologic: Alert & oriented x 3, no focal neuro deficits Psychiatric: Speech and behavior appropriate   ED Course   Medications - No data to display  Orders Placed This Encounter  Procedures  . Urine culture    Standing Status:   Standing    Number of Occurrences:   1  . Pregnancy, urine    Standing Status:   Standing    Number of Occurrences:   1  . Urinalysis, Complete w Microscopic    Standing Status:  Standing    Number of Occurrences:   1  . Glucose, capillary    Standing Status:   Standing    Number of Occurrences:   1  . CBG monitoring, ED    Standing Status:   Standing    Number of Occurrences:   1    Results for orders placed or performed during the hospital encounter of 04/22/19 (from the past 24 hour(s))  Pregnancy, urine     Status: Abnormal   Collection Time: 04/22/19  8:27 AM  Result Value Ref Range   Preg Test, Ur POSITIVE (A) NEGATIVE  Urinalysis, Complete w Microscopic     Status: Abnormal   Collection Time: 04/22/19  8:27 AM  Result Value Ref Range   Color, Urine YELLOW YELLOW   APPearance CLEAR CLEAR   Specific Gravity, Urine 1.025 1.005 - 1.030   pH 5.5 5.0 - 8.0   Glucose, UA NEGATIVE  NEGATIVE mg/dL   Hgb urine dipstick SMALL (A) NEGATIVE   Bilirubin Urine NEGATIVE NEGATIVE   Ketones, ur NEGATIVE NEGATIVE mg/dL   Protein, ur NEGATIVE NEGATIVE mg/dL   Nitrite NEGATIVE NEGATIVE   Leukocytes,Ua SMALL (A) NEGATIVE   Squamous Epithelial / LPF 6-10 0 - 5   WBC, UA 6-10 0 - 5 WBC/hpf   RBC / HPF 6-10 0 - 5 RBC/hpf   Bacteria, UA FEW (A) NONE SEEN  Glucose, capillary     Status: None   Collection Time: 04/22/19  8:49 AM  Result Value Ref Range   Glucose-Capillary 96 70 - 99 mg/dL   No results found.  ED Clinical Impression  1. Less than [redacted] weeks gestation of pregnancy   2. Bacteriuria during pregnancy      ED Assessment/Plan  She is 6 weeks 2/7 days by LMP  Urine Pregnancy positive.  Will check UA to ensure no bacteriuria, asymptomatic UTI, fingerstick due to the reports of increased thirst although she states that this was present during her previous pregnancies.   Fingerstick 96.  This is a fasting glucose  Urine noted she has a few bacteria small leukocytes however this was a contaminated sample.  Send this off for culture to confirm absence of UTI in the meantime we will send home with Keflex 500 mg p.o. 3 times daily for 5 days.  She has follow-up with her OB/GYN in 3 weeks we will discuss all of her medications with them.  Advised her that caution in pregnancy is advised with the propanolol..  Home with Diclegis.  States that she will pick up some prenatal vitamins and does not need a prescription.  Discussed labs, MDM, treatment plan, and plan for follow-up with patient. patient agrees with plan.   Meds ordered this encounter  Medications  . Doxylamine-Pyridoxine 10-10 MG TBEC    Sig: Take 2 tablets by mouth at bedtime. Start 2 tabs at night.  If symptoms persist after 2 days, increase to 1 tab in the morning and 2 tabs at night.  May increase to 1 tab in the morning, 1 tab in the afternoon and 2 tabs at night.    Dispense:  60 tablet    Refill:  0  .  cephALEXin (KEFLEX) 500 MG capsule    Sig: Take 1 capsule (500 mg total) by mouth 3 (three) times daily for 5 days.    Dispense:  15 capsule    Refill:  0    *This clinic note was created using Scientist, clinical (histocompatibility and immunogenetics). Therefore, there may be  occasional mistakes despite careful proofreading.   ?    Melynda Ripple, MD 04/22/19 3254    Melynda Ripple, MD 04/22/19 9826    Melynda Ripple, MD 04/22/19 [phone]

## 2019-04-22 NOTE — ED Triage Notes (Signed)
Pt present to [facility] for pregnancy confirmation. She took a pregnancy test at home and was positive. She was told by her PCP to come here. She states that she has been having morning sickness, amenorrhea, fatigue. Her last period was 03/09/19.

## 2019-04-22 NOTE — Discharge Instructions (Addendum)
Your fingerstick was normal.  You had some bacteria in your urine, so I have sent this off for culture.  I am going to treat this with Keflex 3 times a day for 5 days.  Talk to your doctor about the risk-benefit of taking your ongoing medication such as Lamictal propanolol, Zanaflex during pregnancy.  Diclegis as needed for nausea.  Please start some prenatal vitamins follow-up with your OB/GYN as scheduled in 3 weeks.

## 2019-04-23 LAB — URINE CULTURE

## 2019-04-26 ENCOUNTER — Emergency Department
Admission: EM | Admit: 2019-04-26 | Discharge: 2019-04-26 | Disposition: A | Discharge status: Home / Self Care | Payer: Medicaid Other | Attending: Emergency Medicine | Admitting: Emergency Medicine

## 2019-04-26 ENCOUNTER — Other Ambulatory Visit: Payer: Self-pay

## 2019-04-26 ENCOUNTER — Emergency Department: Payer: Medicaid Other

## 2019-04-26 DIAGNOSIS — Z79899 Other long term (current) drug therapy: Secondary | ICD-10-CM | POA: Insufficient documentation

## 2019-04-26 DIAGNOSIS — Z9049 Acquired absence of other specified parts of digestive tract: Secondary | ICD-10-CM | POA: Diagnosis not present

## 2019-04-26 DIAGNOSIS — Z87891 Personal history of nicotine dependence: Secondary | ICD-10-CM | POA: Insufficient documentation

## 2019-04-26 DIAGNOSIS — O468X1 Other antepartum hemorrhage, first trimester: Secondary | ICD-10-CM | POA: Diagnosis not present

## 2019-04-26 DIAGNOSIS — O209 Hemorrhage in early pregnancy, unspecified: Secondary | ICD-10-CM | POA: Diagnosis present

## 2019-04-26 DIAGNOSIS — O10011 Pre-existing essential hypertension complicating pregnancy, first trimester: Secondary | ICD-10-CM | POA: Diagnosis not present

## 2019-04-26 DIAGNOSIS — N939 Abnormal uterine and vaginal bleeding, unspecified: Secondary | ICD-10-CM

## 2019-04-26 DIAGNOSIS — O418X10 Other specified disorders of amniotic fluid and membranes, first trimester, not applicable or unspecified: Secondary | ICD-10-CM

## 2019-04-26 LAB — CBC WITH DIFFERENTIAL/PLATELET
Abs Immature Granulocytes: 0.04 10*3/uL (ref 0.00–0.07)
Basophils Absolute: 0 10*3/uL (ref 0.0–0.1)
Basophils Relative: 1 %
Eosinophils Absolute: 0.2 10*3/uL (ref 0.0–0.5)
Eosinophils Relative: 2 %
HCT: 35.4 % — ABNORMAL LOW (ref 36.0–46.0)
Hemoglobin: 11.9 g/dL — ABNORMAL LOW (ref 12.0–15.0)
Immature Granulocytes: 1 %
Lymphocytes Relative: 23 %
Lymphs Abs: 1.8 10*3/uL (ref 0.7–4.0)
MCH: 29.8 pg (ref 26.0–34.0)
MCHC: 33.6 g/dL (ref 30.0–36.0)
MCV: 88.5 fL (ref 80.0–100.0)
Monocytes Absolute: 0.4 10*3/uL (ref 0.1–1.0)
Monocytes Relative: 5 %
Neutro Abs: 5.4 10*3/uL (ref 1.7–7.7)
Neutrophils Relative %: 68 %
Platelets: 195 10*3/uL (ref 150–400)
RBC: 4 MIL/uL (ref 3.87–5.11)
RDW: 13.3 % (ref 11.5–15.5)
WBC: 7.8 10*3/uL (ref 4.0–10.5)
nRBC: 0 % (ref 0.0–0.2)

## 2019-04-26 LAB — COMPREHENSIVE METABOLIC PANEL
ALT: 14 U/L (ref 0–44)
AST: 17 U/L (ref 15–41)
Albumin: 3.9 g/dL (ref 3.5–5.0)
Alkaline Phosphatase: 54 U/L (ref 38–126)
Anion gap: 7 (ref 5–15)
BUN: 8 mg/dL (ref 6–20)
CO2: 23 mmol/L (ref 22–32)
Calcium: 8.9 mg/dL (ref 8.9–10.3)
Chloride: 106 mmol/L (ref 98–111)
Creatinine, Ser: 0.7 mg/dL (ref 0.44–1.00)
GFR calc Af Amer: >60 mL/min (ref >60)
GFR calc non Af Amer: >60 mL/min (ref >60)
Glucose, Bld: 103 mg/dL — ABNORMAL HIGH (ref 70–99)
Potassium: 3.4 mmol/L — ABNORMAL LOW (ref 3.5–5.1)
Sodium: 136 mmol/L (ref 135–145)
Total Bilirubin: 0.7 mg/dL (ref 0.3–1.2)
Total Protein: 7 g/dL (ref 6.5–8.1)

## 2019-04-26 LAB — POCT PREGNANCY, URINE: Preg Test, Ur: POSITIVE — AB

## 2019-04-26 LAB — ABO/RH: ABO/RH(D): B POS

## 2019-04-26 LAB — HCG, QUANTITATIVE, PREGNANCY: hCG, Beta Chain, Quant, S: 29429 m[IU]/mL — ABNORMAL HIGH (ref <5)

## 2019-04-26 NOTE — ED Triage Notes (Addendum)
See first nurse note. G3P2. LMP 03/09/2019.

## 2019-04-26 NOTE — ED Provider Notes (Signed)
[facility] Emergency Department Provider Note  ____________________________________________   First MD Initiated Contact with Patient 04/26/19 1506     (approximate)  I have reviewed the triage vital signs and the nursing notes.   HISTORY  Chief Complaint Vaginal Bleeding    HPI Melinda Mendoza is a 34 y.o. female presents emergency department with known pregnancy and vaginal bleeding.  Recently diagnosed with UTI and has been taking Keflex.  This is patient's third pregnancy.  She is concerned due to the amount of bleeding.  No abdominal pain.  No vaginal discharge    Past Medical History:  Diagnosis Date  . Bipolar 1 disorder (HCC)   . Chronic pain   . Hypertension   . Irregular heartbeat   . Migraine   . Sleep apnea     There are no problems to display for this patient.   Past Surgical History:  Procedure Laterality Date  . CHOLECYSTECTOMY    . laproscopy for IUD removal      Prior to Admission medications   Medication Sig Start Date End Date Taking? Authorizing Provider  cephALEXin (KEFLEX) 500 MG capsule Take 1 capsule (500 mg total) by mouth 3 (three) times daily for 5 days. 04/22/19 04/27/19  Domenick Gong, MD  Doxylamine-Pyridoxine 10-10 MG TBEC Take 2 tablets by mouth at bedtime. Start 2 tabs at night.  If symptoms persist after 2 days, increase to 1 tab in the morning and 2 tabs at night.  May increase to 1 tab in the morning, 1 tab in the afternoon and 2 tabs at night. 04/22/19   Domenick Gong, MD  clonazePAM (KLONOPIN) 0.5 MG tablet Take 1 tablet (0.5 mg total) by mouth daily. 10/18/16 04/22/19  Minna Antis, MD  lamoTRIgine (LAMICTAL) 100 MG tablet Take 100 mg by mouth 2 (two) times daily.  04/22/19  [provider]  norethindrone-ethinyl estradiol (JUNEL FE,GILDESS FE,LOESTRIN FE) 1-20 MG-MCG tablet Take 1 tablet by mouth daily. 03/11/17 04/22/19  [provider]  propranolol ER (INDERAL LA) 60 MG 24 hr  capsule Take 60 mg by mouth daily. 11/14/16 04/22/19  [provider]  SUMAtriptan (IMITREX) 100 MG tablet Take 1 tablet earliest onset of migraine.  May repeat once in 2 hours if headache persists or recurs.  Do not exceed 2 tablets in 24 hours. 03/18/17 04/22/19  Drema Dallas, DO  topiramate (TOPAMAX) 100 MG tablet TAKE 2 TABLETS BY MOUTH AT BEDTIME 12/13/17 04/22/19  Drema Dallas, DO    Allergies Patient has no known allergies.  Family History  Problem Relation Age of Onset  . Heart disease Mother   . Diabetes Mother   . Heart disease Maternal Grandmother   . Diverticulitis Maternal Grandmother   . Kidney failure Father   . Gestational diabetes Sister     Social History Social History   Tobacco Use  . Smoking status: Former Smoker    Quit date: 10/19/2011    Years since quitting: 7.5  . Smokeless tobacco: Never Used  Substance Use Topics  . Alcohol use: Yes    Comment: rarely- once per year  . Drug use: No    Review of Systems  Constitutional: No fever/chills Eyes: No visual changes. ENT: No sore throat. Respiratory: Denies cough Cardiovascular: Denies chest pain Gastrointestinal: Denies abdominal pain Genitourinary: Negative for dysuria.  Positive for vaginal bleeding Musculoskeletal: Negative for back pain. Skin: Negative for rash. Psychiatric: no mood changes,     ____________________________________________   PHYSICAL EXAM:  VITAL SIGNS: ED Triage Vitals  Enc Vitals Group     BP 04/26/19 1331 (!) 154/94     Pulse Rate 04/26/19 1331 92     Resp 04/26/19 1331 18     Temp 04/26/19 1331 98.2 F (36.8 C)     Temp Source 04/26/19 1331 Oral     SpO2 04/26/19 1331 100 %     Weight 04/26/19 1330 233 lb 11 oz (106 kg)     Height 04/26/19 1330 5\' 3"  (1.6 m)     Head Circumference --      Peak Flow --      Pain Score 04/26/19 1327 0     Pain Loc --      Pain Edu? --      Excl. in [place]? --     Constitutional: Alert and oriented. Well appearing and in  no acute distress. Eyes: Conjunctivae are normal.  Head: Atraumatic. Nose: No congestion/rhinnorhea. Neck:  supple no lymphadenopathy noted Cardiovascular: Normal rate, regular rhythm. Heart sounds are normal Respiratory: Normal respiratory effort.  No retractions, lungs c t a  Abd: soft nontender bs normal all 4 quad GU: deferred Musculoskeletal: FROM all extremities, warm and well perfused Neurologic:  Normal speech and language.  Skin:  Skin is warm, dry and intact. No rash noted. Psychiatric: Mood and affect are normal. Speech and behavior are normal.  ____________________________________________   LABS (all labs ordered are listed, but only abnormal results are displayed)  Labs Reviewed  CBC WITH DIFFERENTIAL/PLATELET - Abnormal; Notable for the following components:      Result Value   Hemoglobin 11.9 (*)    HCT 35.4 (*)    All other components within normal limits  COMPREHENSIVE METABOLIC PANEL - Abnormal; Notable for the following components:   Potassium 3.4 (*)    Glucose, Bld 103 (*)    All other components within normal limits  HCG, QUANTITATIVE, PREGNANCY - Abnormal; Notable for the following components:   hCG, Beta Chain, Quant, S 29,429 (*)    All other components within normal limits  POCT PREGNANCY, URINE - Abnormal; Notable for the following components:   Preg Test, Ur POSITIVE (*)    All other components within normal limits  ABO/RH   ____________________________________________   ____________________________________________  RADIOLOGY  Ultrasound OB less than 14 weeks shows a IUP of approximately 6 weeks with subchorionic hemorrhage  ____________________________________________   PROCEDURES  Procedure(s) performed: No  Procedures    ____________________________________________   INITIAL IMPRESSION / ASSESSMENT AND PLAN / ED COURSE  Pertinent labs & imaging results that were available during my care of the patient were reviewed by me and  considered in my medical decision making (see chart for details).   Patient is 34 year old female presents emergency department with her third pregnancy and vaginal bleeding.  Patient states that she is probably 6 to [redacted] weeks pregnant.  Physical exam shows patient to appear well.  Abdomen is nontender.  Remainder of exam is unremarkable  DDx: Threatened miscarriage, subchorionic hemorrhage, blighted ovum  CBC is normal, comprehensive metabolic panel is normal, pregnancy POC is positive, beta hCG is 29,429, ABO/Rh is B+  Ultrasound OB less than 14 weeks does show an IUP but does have subchorionic hemorrhage.  I explained all findings to the patient.  She is to follow-up with her regular GYN doctor if the bleeding worsens or she has abdominal cramping.  She already has an appointment in 2 weeks.  Explained her she could get an earlier  appointment if she becomes worse or she may return to the emergency department.  She states she understands will comply.  She was discharged in stable condition.    Melinda Mendoza was evaluated in Emergency Department on 04/26/2019 for the symptoms described in the history of present illness. She was evaluated in the context of the global COVID-19 pandemic, which necessitated consideration that the patient might be at risk for infection with the SARS-CoV-2 virus that causes COVID-19. Institutional protocols and algorithms that pertain to the evaluation of patients at risk for COVID-19 are in a state of rapid change based on information released by regulatory bodies including the CDC and federal and state organizations. These policies and algorithms were followed during the patient's care in the ED.   As part of my medical decision making, I reviewed the following data within the electronic MEDICAL RECORD NUMBER Nursing notes reviewed and incorporated, Labs reviewed , Old chart reviewed, Radiograph reviewed , Notes from prior ED visits and [place] Controlled Substance  Database  ____________________________________________   FINAL CLINICAL IMPRESSION(S) / ED DIAGNOSES  Final diagnoses:  Subchorionic hemorrhage of placenta in first trimester, single or unspecified fetus      NEW MEDICATIONS STARTED DURING THIS VISIT:  New Prescriptions   No medications on file     Note:  This document was prepared using Dragon voice recognition software and may include unintentional dictation errors.    Faythe Ghee, PA-C 04/26/19 1558    Jene Every, MD 04/26/19 [phone]

## 2019-04-26 NOTE — ED Triage Notes (Signed)
FIRST NURSE NOTE:  Pt reports recent positive pregnancy test, started having spotting and recently dx with UTI, on antibiotics currently.  Denies any pain at this time.

## 2019-04-26 NOTE — Discharge Instructions (Addendum)
Follow-up with your regular doctor.  Call for an earlier appointment if you have increased bleeding Take prenatals Return if worsening

## 2019-04-27 ENCOUNTER — Telehealth: Payer: Self-pay

## 2019-04-27 NOTE — Telephone Encounter (Signed)
Pt was seen at the ER yesterday, has NOB with you scheduled for Monday 05/04/19. She wants to know if you think she should be seen earlier per yesterdays ER encounter, or wait until Monday. Says she stopped spotting before she left hospital yesterday, started spotting again last night/feels bloated but no cramping. Is trying to do bed rest as much as she can. Please advise.

## 2019-04-27 NOTE — Telephone Encounter (Signed)
It sounds like she can wait  and continue bed rest if there is no cramping and only light spotting. If she is bleeding heavily she should go to ER.

## 2019-04-27 NOTE — Telephone Encounter (Signed)
Pt aware.

## 2019-05-04 ENCOUNTER — Telehealth: Payer: Self-pay

## 2019-05-04 NOTE — Telephone Encounter (Signed)
Crystal to call pt as I do not have access to phone this PM

## 2019-05-04 NOTE — Telephone Encounter (Signed)
I don't see patients in the ER. It sounds like she needs a note from someone in the ER.

## 2019-05-04 NOTE — Telephone Encounter (Signed)
Spoke w/patient. Advised she would need to contact the ER to get the note as that is who advised her to be on bed rest. Patient now inquiring if it's ok for her to receive oral sex if she's going to be on bedrest for a while?

## 2019-05-04 NOTE — Telephone Encounter (Signed)
Pt called Triage line stating she was seen in the ER by [facility].for early OB bleeding. She has a subchorionic hematoma.She states she was put on bedrest and needs a note for work.  She states she thought her NOB appointment was today but it isn't until 3/29.

## 2019-05-11 ENCOUNTER — Encounter: Payer: Self-pay | Admitting: Advanced Practice Midwife

## 2019-05-12 ENCOUNTER — Other Ambulatory Visit: Payer: Self-pay

## 2019-05-12 DIAGNOSIS — O09891 Supervision of other high risk pregnancies, first trimester: Secondary | ICD-10-CM

## 2019-05-13 DIAGNOSIS — O09893 Supervision of other high risk pregnancies, third trimester: Secondary | ICD-10-CM | POA: Insufficient documentation

## 2019-05-18 ENCOUNTER — Other Ambulatory Visit: Payer: Self-pay

## 2019-05-18 ENCOUNTER — Ambulatory Visit
Admission: RE | Admit: 2019-05-18 | Discharge: 2019-05-18 | Disposition: A | Discharge status: Home / Self Care | Payer: Medicaid Other | Source: Ambulatory Visit | Attending: Maternal & Fetal Medicine | Admitting: Maternal & Fetal Medicine

## 2019-05-18 ENCOUNTER — Ambulatory Visit
Admission: RE | Admit: 2019-05-18 | Discharge: 2019-05-18 | Disposition: A | Discharge status: Home / Self Care | Payer: Medicaid Other | Source: Ambulatory Visit

## 2019-05-18 ENCOUNTER — Ambulatory Visit (HOSPITAL_BASED_OUTPATIENT_CLINIC_OR_DEPARTMENT_OTHER)
Admission: RE | Admit: 2019-05-18 | Discharge: 2019-05-18 | Disposition: A | Discharge status: Home / Self Care | Payer: Medicaid Other | Source: Ambulatory Visit | Attending: Maternal & Fetal Medicine | Admitting: Maternal & Fetal Medicine

## 2019-05-18 DIAGNOSIS — O09891 Supervision of other high risk pregnancies, first trimester: Secondary | ICD-10-CM

## 2019-05-18 DIAGNOSIS — Z888 Allergy status to other drugs, medicaments and biological substances status: Secondary | ICD-10-CM | POA: Insufficient documentation

## 2019-05-18 DIAGNOSIS — Z9049 Acquired absence of other specified parts of digestive tract: Secondary | ICD-10-CM | POA: Insufficient documentation

## 2019-05-18 DIAGNOSIS — M549 Dorsalgia, unspecified: Secondary | ICD-10-CM | POA: Diagnosis not present

## 2019-05-18 DIAGNOSIS — Z833 Family history of diabetes mellitus: Secondary | ICD-10-CM | POA: Insufficient documentation

## 2019-05-18 DIAGNOSIS — O99891 Other specified diseases and conditions complicating pregnancy: Secondary | ICD-10-CM | POA: Diagnosis present

## 2019-05-18 DIAGNOSIS — G4730 Sleep apnea, unspecified: Secondary | ICD-10-CM | POA: Insufficient documentation

## 2019-05-18 DIAGNOSIS — Z8249 Family history of ischemic heart disease and other diseases of the circulatory system: Secondary | ICD-10-CM | POA: Diagnosis not present

## 2019-05-18 DIAGNOSIS — O99341 Other mental disorders complicating pregnancy, first trimester: Secondary | ICD-10-CM | POA: Diagnosis not present

## 2019-05-18 DIAGNOSIS — Z3A10 10 weeks gestation of pregnancy: Secondary | ICD-10-CM | POA: Insufficient documentation

## 2019-05-18 DIAGNOSIS — O10911 Unspecified pre-existing hypertension complicating pregnancy, first trimester: Secondary | ICD-10-CM | POA: Insufficient documentation

## 2019-05-18 DIAGNOSIS — F319 Bipolar disorder, unspecified: Secondary | ICD-10-CM | POA: Insufficient documentation

## 2019-05-18 DIAGNOSIS — G8929 Other chronic pain: Secondary | ICD-10-CM | POA: Diagnosis not present

## 2019-05-18 DIAGNOSIS — O99351 Diseases of the nervous system complicating pregnancy, first trimester: Secondary | ICD-10-CM | POA: Diagnosis not present

## 2019-05-18 DIAGNOSIS — Z841 Family history of disorders of kidney and ureter: Secondary | ICD-10-CM | POA: Insufficient documentation

## 2019-05-18 NOTE — Progress Notes (Signed)
Referring physician:  Drinda Butts, CNM Length of consultation:  40 minutes  Ms. Frieling was referred to [facility] for genetic counseling due to medication exposures and to review prenatal screening and testing options.  This note summarizes the information we discussed.  She also met with Dr. Manfred Shirts for an MFM consultation; see that note for details and recommendations regarding her cHTN and BMI.  We first reviewed the family history obtained at her genetic counseling visit in 2015 as well as the pregnancy history.  Since that visit, there were no new diagnoses in the family history.  Her mother and maternal grandmother have both passed away since that time, due to sepsis. Both had underlying conditions including diabetes and HTN as well as metastatic breast cancer in her grandmother. The breast cancer diagnosis was around age 35 years and there are no other family members with this condition.  In the remainder of the family history, there are no individuals with developmental disabilities, birth defects, recurrent miscarriages or known genetic conditions. She did mention a possible easy bruising/bleeding condition in her maternal grandmother but no specific diagnosis is known and the patient has had no issues with excessive bleeding with her prior deliveries.  The couple has a healthy 67 year old daughter and the patient has a 12 year old daughter from a prior relationship who is not living with her but is known to the patient and is also in good health.  In the current pregnancy,  Ms. Madill reported light spotting on two occasions and a UTI, otherwise she has had no complications.    Prior to the pregnancy, Ms. Inoue was taking multiple medications including topiramate for nerve pain.  She reports that she stopped this medication in early March as soon as she suspected that she was pregnant.  Currently, she is taking only her prenatal vitamins and Flexeril as needed for  pain.  This medication has not been shown to increase the risk for birth defects.  We did review that topiramate (Topamax) has been associated with an increased risk for anomalies when taken in pregnancy, with some studies showing a specific association with cleft lip and/or palate as well as hypospadias.  Given the gestational age, Ms. Mackintosh stopped this medication at [redacted] weeks gestation (or 3 weeks embryonic development).  The development of cleft lip/palate occurs at 4-6 weeks embryonic age (43-[redacted] weeks gestational age), which is after she stopped the medications.  We would therefore expect a low risk for anomalies based upon this exposure.  A detailed anatomy ultrasound at [redacted] weeks gestation may provide additional reassurance, though it is important to remember that not all anomalies can be detected by ultrasound. This ultrasound was scheduled at [facility] for [redacted] weeks gestation.  We offered the following routine screening tests for this pregnancy:  The most accurate screening option for chromosome conditions is cell free fetal DNA testing.  Though this is typically reserved for pregnancies at increased risk for aneuploidy, it is currently being made available and many insurance companies are adding coverage for this testing in low risk patients during [place].  This test utilizes a maternal blood sample and DNA sequencing technology to isolate circulating cell free fetal DNA from maternal plasma.  The fetal DNA can then be analyzed for DNA sequences that are derived from the three most common chromosomes involved in aneuploidy, chromosomes 13, 18, and 21.  If the overall amount of DNA is greater than the expected level for any  of these chromosomes, aneuploidy is suspected.  The detection rates are >99% for Down syndrome, >98% for trisomy 18 and >91% for Trisomy 13.  While we do not consider it a replacement for invasive testing and karyotype analysis, a negative result from this testing would be reassuring,  though not a guarantee of a normal chromosome complement for the baby.  An abnormal result may be suggestive of an abnormal chromosome complement, though we would still recommend CVS or amniocentesis to confirm any findings from this testing.  First trimester screening, which includes nuchal translucency ultrasound screen and first trimester maternal serum marker screening, is the test that has most recently been available for low risk patients.  The nuchal translucency has approximately an 80% detection rate for Down syndrome and can be positive for other chromosome abnormalities as well as congenital heart defects.  When combined with a maternal serum marker screening, the detection rate is up to 90% for Down syndrome and up to 97% for trisomy 18.   Given current recommendations during COVID, we are offering only the biochemical testing portion of this testing (without the ultrasound and NT portion), which has a much lower detection rate.  Maternal serum marker screening, or "quad" screen, is a blood test that measures pregnancy proteins, can provide risk assessments for Down syndrome, trisomy 18, and open neural tube defects (spina bifida, anencephaly). Because it does not directly examine the fetus, it cannot positively diagnose or rule out these problems. This is a second trimester option which could be offered along with the anatomy ultrasound. It can detect approximately 75% of babies with Down syndrome, 80% of babies with open spina bifida and 70% of babies with trisomy 95.  Targeted ultrasound uses high frequency sound waves to create an image of the developing fetus.  An ultrasound is often recommended as a routine means of evaluating the pregnancy.  It is also used to screen for fetal anatomy problems (for example, a heart defect) that might be suggestive of a chromosomal or other abnormality. We are currently not recommending a first trimester ultrasound other than that which would be ordered for  dating and viability.  Should these screening tests indicate an increased concern, then the following additional testing options would be offered:  The chorionic villus sampling procedure is available for first trimester chromosome analysis.  This involves the withdrawal of a small amount of chorionic villi (tissue from the developing placenta).  Risk of pregnancy loss is estimated to be approximately 1 in 200 to 1 in 100 (0.5 to 1%).  There is approximately a 1% (1 in 100) chance that the CVS chromosome results will be unclear.  Chorionic villi cannot be tested for neural tube defects.     Amniocentesis involves the removal of a small amount of amniotic fluid from the sac surrounding the fetus with the use of a thin needle inserted through the maternal abdomen and uterus.  Ultrasound guidance is used throughout the procedure.  Fetal cells from amniotic fluid are directly evaluated and > 99.5% of chromosome problems and > 98% of open neural tube defects can be detected. This procedure is generally performed after the 15th week of pregnancy.  The main risks to this procedure include complications leading to miscarriage in less than 1 in 200 cases (0.5%).  Cystic Fibrosis and Spinal Muscular Atrophy (SMA) screening were also discussed with the patient. Both conditions are recessive, which means that both parents must be carriers in order to have a child with the disease.  Cystic fibrosis (CF) is one of the most common genetic conditions in persons of Caucasian ancestry.  This condition occurs in approximately 1 in 2,500 Caucasian persons and results in thickened secretions in the lungs, digestive, and reproductive systems.  For a baby to be at risk for having CF, both of the parents must be carriers for this condition.  Approximately 1 in 11 Caucasian persons is a carrier for CF.  Current carrier testing looks for the most common mutations in the gene for CF and can detect approximately 90% of carriers in the  Caucasian population.  This means that the carrier screening can greatly reduce, but cannot eliminate, the chance for an individual to have a child with CF.  If an individual is found to be a carrier for CF, then carrier testing would be available for the partner. As part of [place] newborn screening profile, all babies born in the state of [place] will have a two-tier screening process.  Specimens are first tested to determine the concentration of immunoreactive trypsinogen (IRT).  The top 5% of specimens with the highest IRT values then undergo DNA testing using a panel of over 40 common CF mutations. SMA is a neurodegenerative disorder that leads to atrophy of skeletal muscle and overall weakness.  This condition is also more prevalent in the Caucasian population, with 1 in 40-1 in 60 persons being a carrier and 1 in 6,000-1 in 10,000 children being affected.  There are multiple forms of the disease, with some causing death in infancy to other forms with survival into adulthood.  The genetics of SMA is complex, but carrier screening can detect up to 95% of carriers in the Caucasian population.  Similar to CF, a negative result can greatly reduce, but cannot eliminate, the chance to have a child with SMA. The patient declined carrier screening for CF and SMA.     After consideration of the options, Ms. Raiford Noble indicated that she would like to have MaterniT21 PLUS with [facility]. She is only [redacted] weeks gestation at this visit.  Given the maternal BMI, we would recommend having this testing at approximately [redacted] weeks gestation. She planned to speak with her OB to arrange this in their office. The patient declined carrier testing for CF and SMA. The anatomy ultrasound was scheduled at [facility] for [redacted] weeks gestation.   The patient was encouraged to call with questions or concerns.  We can be contacted at [phone].  Labs ordered: none today  Wilburt Finlay, MS, CGC

## 2019-05-18 NOTE — Progress Notes (Signed)
[facility] Consultation     HPI: Melinda Mendoza is a 34 y.o. G3P2002 at 10 weeks gestatation by LMP consistent with US performed at [facility] on 04/26/19; crl was consistent with 6w 1 day.  She is referred for consultation due to cHTN, BMI 43 and multiple medication exposures due to back pain and for mood stabilization. She reports stable mood off meds.  She met with our genetic counselor today.  Medications (eg topirimate) were discontinued at 5-[redacted] weeks gestation and unlikely to be associated with increased risk to fetal development. Please see that note for details.  Her cHTN began after her last delivery in 2015. She developed preeclampsia and required medications (propranolol); however has not required during pregnancy at this point.    Past Medical History: Patient  has a past medical history of Bipolar 1 disorder ([place]), Chronic pain, Hypertension, Irregular heartbeat, Migraine, and Sleep apnea.  Past Surgical History: She  has a past surgical history that includes Cholecystectomy and laproscopy for IUD removal.  Obstetric History:  2015: 39 weeks, female, svd 7 lb 15 oz, preeclampsia 2010: term, 6lb 7 oz, female, uncomplicated   Current Outpatient Medications on File Prior to Encounter  Medication Sig Dispense Refill  . cyclobenzaprine (FLEXERIL) 5 MG tablet Take 5 mg by mouth at bedtime.    . Prenatal Vit-Fe Fumarate-FA (PRENATAL MULTIVITAMIN) TABS tablet Take 1 tablet by mouth daily at 12 noon.    . Doxylamine-Pyridoxine 10-10 MG TBEC Take 2 tablets by mouth at bedtime. Start 2 tabs at night.  If symptoms persist after 2 days, increase to 1 tab in the morning and 2 tabs at night.  May increase to 1 tab in the morning, 1 tab in the afternoon and 2 tabs at night. 60 tablet 0  . [DISCONTINUED] clonazePAM (KLONOPIN) 0.5 MG tablet Take 1 tablet (0.5 mg total) by mouth daily. 7 tablet 0  . [DISCONTINUED] lamoTRIgine (LAMICTAL) 100 MG tablet Take 100 mg by mouth 2 (two) times  daily.    . [DISCONTINUED] norethindrone-ethinyl estradiol (JUNEL FE,GILDESS FE,LOESTRIN FE) 1-20 MG-MCG tablet Take 1 tablet by mouth daily.    . [DISCONTINUED] propranolol ER (INDERAL LA) 60 MG 24 hr capsule Take 60 mg by mouth daily.    . [DISCONTINUED] SUMAtriptan (IMITREX) 100 MG tablet Take 1 tablet earliest onset of migraine.  May repeat once in 2 hours if headache persists or recurs.  Do not exceed 2 tablets in 24 hours. 10 tablet 2  . [DISCONTINUED] topiramate (TOPAMAX) 100 MG tablet TAKE 2 TABLETS BY MOUTH AT BEDTIME 180 tablet 1   No current facility-administered medications on file prior to encounter.   Allergies: she reports allergy to MONISTAT rash and gabapentin (increased weight gain) Social History: Patient  reports that she quit smoking about 7 years ago. She has never used smokeless tobacco. She reports current alcohol use. She reports that she does not use drugs.   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  Family History: family history includes Diabetes in her mother; Diverticulitis in her maternal grandmother; Gestational diabetes in her sister; Heart disease in her maternal grandmother and mother; Kidney failure in her father.   Physical Exam: BP (!) 133/95   Pulse (!) 101   Temp 98 F (36.7 C)   Wt 111.4 kg   LMP 03/09/2019   BMI 43.49 kg/m   [place] today: +cardiac activity. CRL c/w gestational age.   Asessement: 1. Medication exposure during first trimester of pregnancy  Medications (eg topirimate) were discontinued at 5-[redacted] weeks gestation and unlikely to be associated with increased risk to fetal develoopment. Please see that note for details.    BMI 43 Many of  the risks associated with obesity in pregnancy include preeclampsia, gestational diabetes, and increased risks for operative delivery due to fetal macrosomia.  Maternal obesity also increases the risk for  postpartum infection and venous thromboembolism. Maternal obesity is also associated  with maternal cardiac dysfunction, sleep apnea and nonalcoholic fatty liver. For the fetus, maternal obesity may also be associated with an increased risk for certain congenital malformations such as spina bifida and cleft lip/palate.    General surveillance and recommendations during pregnancy if maternal BMI is  >=40: --According to the institution of medicine, the optimal weight gain during pregnancy for her (at the current weight) with twins would be 25-42 lbs.   --Nutrition referral/lifestyles center may be useful --baseline ECG and Echo if abnormal ECG or if comorbidity(eg.hypertension) present --We addressed the use of low dose aspirin for preeclampsia risk reduction,   --Additional 1 mg of folic acid daily  --Sleep apnea screening by clinical symptoms--she does snore and reports negative sleep apnea screening in the past year (did have signs of it but reports were not severe enough to warrant CPAP) she reports snoring ceases with weight loss.   --Antenatal surveillance with NST/AFI (MVP) weekly beginning at 36 weeks (sooner if clinically indicated) --Ob anesthesiology referral in the third trimester --Bariatric equipment (as appropriate) in labor and delivery and VTE prophylaxis postpartum (can include use of SCDs and early ambulation or  LMWH [place],  if additional risk factors are present) --We would recommend consideration of 3 gm instead of 2 gm of cefazolin for prophylaxis at the time of a cesarean delivery.  Chronic Hypertension  Chronic  hypertension  may be associated with an increased maternal risk for preeclampsia/eclampsia/HELLP and adverse cardiovascular events, particularly in the setting of poorly controlled hypertensive disease.  HTN can be associated with the need for preterm birth in the setting of preeclampsia, fetal growth restriction, and abruption (premature separation of the placenta) --low dose aspirin daily after 12 weeks to reduce risk for preeclampsia --previously on  meds (propranlol) can start labetalol if bps severe range.  --monthly growth evaluations after 24 weeks --antenatal testing weekly beginning at 36 weeks, sooner if clinically indicated --delivery 39th week, unless indicated sooner --needs home blood pressure cuff --she has medicaid so may need to have ordered   Bipolar She recieves counseling/support with primary MD. We discussed posslbe need for additional support person as pregnancy progresses/postpartum. She is very aware of her symptoms and will reach out to primary if sxs worsen. Reports stable mood now.   Total time spent with the patient was 30 minutes with greater than 50% spent in counseling and coordination of care. We appreciate this interesting consult and will be happy to be involved in the ongoing care of Ms. Simien in anyway her obstetricians desire.  Manfred Shirts,  MD [facility]

## 2019-06-04 LAB — OB RESULTS CONSOLE RPR: RPR: NONREACTIVE

## 2019-06-04 LAB — OB RESULTS CONSOLE HEPATITIS B SURFACE ANTIGEN: Hepatitis B Surface Ag: NEGATIVE

## 2019-06-04 LAB — OB RESULTS CONSOLE VARICELLA ZOSTER ANTIBODY, IGG: Varicella: NON-IMMUNE/NOT IMMUNE

## 2019-06-04 LAB — OB RESULTS CONSOLE RUBELLA ANTIBODY, IGM: Rubella: NON-IMMUNE/NOT IMMUNE

## 2019-07-09 ENCOUNTER — Other Ambulatory Visit: Payer: Self-pay | Admitting: Maternal & Fetal Medicine

## 2019-07-09 DIAGNOSIS — O09891 Supervision of other high risk pregnancies, first trimester: Secondary | ICD-10-CM

## 2019-07-09 DIAGNOSIS — Z3689 Encounter for other specified antenatal screening: Secondary | ICD-10-CM

## 2019-07-16 ENCOUNTER — Other Ambulatory Visit: Payer: Self-pay

## 2019-07-16 ENCOUNTER — Ambulatory Visit: Payer: Medicaid Other

## 2019-07-16 ENCOUNTER — Ambulatory Visit
Admission: RE | Admit: 2019-07-16 | Discharge: 2019-07-16 | Disposition: A | Discharge status: Home / Self Care | Payer: Medicaid Other | Source: Ambulatory Visit | Attending: Maternal & Fetal Medicine | Admitting: Maternal & Fetal Medicine

## 2019-07-16 DIAGNOSIS — O09891 Supervision of other high risk pregnancies, first trimester: Secondary | ICD-10-CM | POA: Diagnosis not present

## 2019-07-16 DIAGNOSIS — O10912 Unspecified pre-existing hypertension complicating pregnancy, second trimester: Secondary | ICD-10-CM | POA: Diagnosis not present

## 2019-07-16 DIAGNOSIS — Z3A18 18 weeks gestation of pregnancy: Secondary | ICD-10-CM | POA: Diagnosis not present

## 2019-07-16 DIAGNOSIS — Z3689 Encounter for other specified antenatal screening: Secondary | ICD-10-CM | POA: Insufficient documentation

## 2019-09-22 ENCOUNTER — Other Ambulatory Visit
Admission: RE | Admit: 2019-09-22 | Discharge: 2019-09-22 | Disposition: A | Discharge status: Home / Self Care | Payer: Medicaid Other | Source: Ambulatory Visit | Attending: Anesthesiology | Admitting: Anesthesiology

## 2019-09-22 ENCOUNTER — Other Ambulatory Visit: Payer: Self-pay

## 2019-09-22 NOTE — Consult Note (Signed)
[facility] Anesthesia Consultation  Melinda Mendoza FWY:[id number] DOB: 02/08/1986 DOA: 09/22/2019 PCP: Rayetta Humphrey, MD   Requesting physician: Dr. Feliberto Gottron Date of consultation: 09/22/19 Reason for consultation: Obesity during pregnancy  CHIEF COMPLAINT:  Obesity during pregnancy  HISTORY OF PRESENT ILLNESS: Melinda Mendoza  is a 35 y.o. female with a known history of obesity during pregnancy. Hx of cHTN, not currently on any antihypertensive medication. Mild intermittent asthma, typically uses inhaler when allergies are bothering her. Two prior vaginal deliveries, both with epidural analgesia.  PAST MEDICAL HISTORY:   Past Medical History:  Diagnosis Date  . Bipolar 1 disorder ([facility])   . Chronic pain   . Hypertension   . Irregular heartbeat   . Migraine   . Sleep apnea     PAST SURGICAL HISTORY:  Past Surgical History:  Procedure Laterality Date  . CHOLECYSTECTOMY    . laproscopy for IUD removal      SOCIAL HISTORY:  Social History   Tobacco Use  . Smoking status: Former Smoker    Quit date: 10/19/2011    Years since quitting: 7.9  . Smokeless tobacco: Never Used  Substance Use Topics  . Alcohol use: Not Currently    Comment: rarely- once per year    FAMILY HISTORY:  Family History  Problem Relation Age of Onset  . Heart disease Mother   . Diabetes Mother   . Heart disease Maternal Grandmother   . Diverticulitis Maternal Grandmother   . Kidney failure Father   . Gestational diabetes Sister     DRUG ALLERGIES: No Known Allergies  REVIEW OF SYSTEMS:   RESPIRATORY: No cough, shortness of breath, wheezing.  CARDIOVASCULAR: No chest pain, orthopnea, edema.  HEMATOLOGY: No anemia, easy bruising or bleeding SKIN: No rash or lesion. NEUROLOGIC: No tingling, numbness, weakness.  PSYCHIATRY: No anxiety or depression.   MEDICATIONS AT HOME:  Prior to Admission medications   Medication Sig Start Date End Date Taking?  Authorizing Provider  aspirin 81 MG chewable tablet Chew 81 mg by mouth daily.    [provider]  cyclobenzaprine (FLEXERIL) 5 MG tablet Take 5 mg by mouth at bedtime.    [provider]  Prenatal Vit-Fe Fumarate-FA (PRENATAL MULTIVITAMIN) TABS tablet Take 1 tablet by mouth daily at 12 noon. They have added iron and folate.    [provider]  clonazePAM (KLONOPIN) 0.5 MG tablet Take 1 tablet (0.5 mg total) by mouth daily. 10/18/16 04/22/19  Minna Antis, MD  lamoTRIgine (LAMICTAL) 100 MG tablet Take 100 mg by mouth 2 (two) times daily.  04/22/19  [provider]  norethindrone-ethinyl estradiol (JUNEL FE,GILDESS FE,LOESTRIN FE) 1-20 MG-MCG tablet Take 1 tablet by mouth daily. 03/11/17 04/22/19  [provider]  propranolol ER (INDERAL LA) 60 MG 24 hr capsule Take 60 mg by mouth daily. 11/14/16 04/22/19  [provider]  SUMAtriptan (IMITREX) 100 MG tablet Take 1 tablet earliest onset of migraine.  May repeat once in 2 hours if headache persists or recurs.  Do not exceed 2 tablets in 24 hours. 03/18/17 04/22/19  Drema Dallas, DO  topiramate (TOPAMAX) 100 MG tablet TAKE 2 TABLETS BY MOUTH AT BEDTIME 12/13/17 04/22/19  Drema Dallas, DO      PHYSICAL EXAMINATION:   VITAL SIGNS: Last menstrual period 03/09/2019.  GENERAL:  34 y.o.-year-old patient no acute distress.  HEENT: Head atraumatic, normocephalic. Oropharynx and nasopharynx clear. MP 1, TM distance >3 cm, normal mouth opening. LUNGS: No use of accessory muscles  of respiration.   EXTREMITIES: No pedal edema, cyanosis, or clubbing.  NEUROLOGIC: normal gait PSYCHIATRIC: The patient is alert and oriented x 3.  SKIN: No obvious rash, lesion, or ulcer.    IMPRESSION AND PLAN:   Melinda Mendoza  is a 34 y.o. female presenting with obesity during pregnancy. BMI is currently 44 at [redacted] weeks gestation.   Airway exam reassuring. Spinal interspaces palpable.  We discussed analgesic options during  labor including epidural analgesia. Discussed that in obesity there can be increased difficulty with epidural placement or even failure of successful epidural. We also discussed that even after successful epidural placement there is increased risk of catheter migration out of the epidural space that would require catheter replacement. Discussed use of epidural vs spinal vs GA if cesarean delivery is required. Discussed increased risk of difficult intubation during pregnancy should an emergency cesarean delivery be required.   Plan for delivery at [facility].

## 2019-11-15 ENCOUNTER — Observation Stay
Admission: EM | Admit: 2019-11-15 | Discharge: 2019-11-16 | Disposition: A | Discharge status: Home / Self Care | Payer: Medicaid Other | Attending: Obstetrics and Gynecology | Admitting: Obstetrics and Gynecology

## 2019-11-15 ENCOUNTER — Other Ambulatory Visit: Payer: Self-pay

## 2019-11-15 ENCOUNTER — Encounter: Payer: Self-pay | Admitting: Obstetrics and Gynecology

## 2019-11-15 DIAGNOSIS — Z79899 Other long term (current) drug therapy: Secondary | ICD-10-CM | POA: Diagnosis not present

## 2019-11-15 DIAGNOSIS — Z87891 Personal history of nicotine dependence: Secondary | ICD-10-CM | POA: Insufficient documentation

## 2019-11-15 DIAGNOSIS — F319 Bipolar disorder, unspecified: Secondary | ICD-10-CM | POA: Diagnosis not present

## 2019-11-15 DIAGNOSIS — O133 Gestational [pregnancy-induced] hypertension without significant proteinuria, third trimester: Principal | ICD-10-CM | POA: Insufficient documentation

## 2019-11-15 DIAGNOSIS — Z3A36 36 weeks gestation of pregnancy: Secondary | ICD-10-CM | POA: Insufficient documentation

## 2019-11-15 DIAGNOSIS — Z7982 Long term (current) use of aspirin: Secondary | ICD-10-CM | POA: Insufficient documentation

## 2019-11-15 DIAGNOSIS — O10919 Unspecified pre-existing hypertension complicating pregnancy, unspecified trimester: Secondary | ICD-10-CM | POA: Diagnosis present

## 2019-11-15 HISTORY — DX: Depression, unspecified: F32.A

## 2019-11-15 HISTORY — DX: Anxiety disorder, unspecified: F41.9

## 2019-11-15 NOTE — OB Triage Note (Signed)
Pt is a G3P2002 and [redacted]w[redacted]d presenting to L&D with complaint of elevated blood pressures the last two hours. Pt denies HA, blurry vision, epigastric pain. Pt states she has been nauseas since yesterday and noticed significant swelling in hands and legs since yesterday. Pt states it hurts to walk and move her toes. Pt confirms positive fetal movement, denies VB and LOF. Pt states she feels vaginal pressure the last several days. Monitors applied and assessing. Initial BP 136/98.

## 2019-11-16 DIAGNOSIS — O133 Gestational [pregnancy-induced] hypertension without significant proteinuria, third trimester: Secondary | ICD-10-CM | POA: Diagnosis not present

## 2019-11-16 LAB — COMPREHENSIVE METABOLIC PANEL
ALT: 13 U/L (ref 0–44)
AST: 16 U/L (ref 15–41)
Albumin: 2.7 g/dL — ABNORMAL LOW (ref 3.5–5.0)
Alkaline Phosphatase: 64 U/L (ref 38–126)
Anion gap: 7 (ref 5–15)
BUN: 7 mg/dL (ref 6–20)
CO2: 23 mmol/L (ref 22–32)
Calcium: 8.6 mg/dL — ABNORMAL LOW (ref 8.9–10.3)
Chloride: 108 mmol/L (ref 98–111)
Creatinine, Ser: 0.43 mg/dL — ABNORMAL LOW (ref 0.44–1.00)
GFR calc Af Amer: >60 mL/min (ref >60)
GFR calc non Af Amer: >60 mL/min (ref >60)
Glucose, Bld: 104 mg/dL — ABNORMAL HIGH (ref 70–99)
Potassium: 3.4 mmol/L — ABNORMAL LOW (ref 3.5–5.1)
Sodium: 138 mmol/L (ref 135–145)
Total Bilirubin: 0.5 mg/dL (ref 0.3–1.2)
Total Protein: 5.8 g/dL — ABNORMAL LOW (ref 6.5–8.1)

## 2019-11-16 LAB — CBC
HCT: 27.8 % — ABNORMAL LOW (ref 36.0–46.0)
Hemoglobin: 9.7 g/dL — ABNORMAL LOW (ref 12.0–15.0)
MCH: 29.3 pg (ref 26.0–34.0)
MCHC: 34.9 g/dL (ref 30.0–36.0)
MCV: 84 fL (ref 80.0–100.0)
Platelets: 157 10*3/uL (ref 150–400)
RBC: 3.31 MIL/uL — ABNORMAL LOW (ref 3.87–5.11)
RDW: 16.2 % — ABNORMAL HIGH (ref 11.5–15.5)
WBC: 8.7 10*3/uL (ref 4.0–10.5)
nRBC: 0 % (ref 0.0–0.2)

## 2019-11-16 LAB — PROTEIN / CREATININE RATIO, URINE
Creatinine, Urine: 57 mg/dL
Protein Creatinine Ratio: 0.14 mg/mg{Cre} (ref 0.00–0.15)
Total Protein, Urine: 8 mg/dL

## 2019-11-16 NOTE — OB Triage Note (Signed)
Discharge instructions reviewed and questions answered. CNM reviewed red flag labor symptoms and discussed HTN concerns. Pt verbalized understanding. Pt stable at time of discharge.

## 2019-11-16 NOTE — Discharge Summary (Signed)
Melinda Mendoza is a 34 y.o. female. She is at [redacted]w[redacted]d gestation. Patient's last menstrual period was 03/09/2019. Estimated Date of Delivery: 12/14/19  Prenatal care site: [facility]  Current pregnancy complicated by:  1. Obesity, BMI >40 2. CHTN, stopped meds with pregnancy; stopped propanolol early preg.  3. Mental health Dx including bipolar d/o and postpartum depression; Stopped taking Topiramate, propanolol, and Cymbalta after she had a positive pregnancy test 4. Asthma; Rescue inhaler - rarely needs; on Singulair.  5. Rubella and Varicella non-immune  Chief complaint: increased swelling and elevated BP at home tonight, 150/90s.    S: Resting comfortably. no CTX, no VB.no LOF,  Active fetal movement. Denies: HA, visual changes, SOB, or RUQ/epigastric pain  Maternal Medical History:   Past Medical History:  Diagnosis Date  . Anxiety   . Bipolar 1 disorder (HCC)   . Chronic pain   . Depression   . Hypertension   . Irregular heartbeat   . Migraine   . Sleep apnea     Past Surgical History:  Procedure Laterality Date  . CHOLECYSTECTOMY    . laproscopy for IUD removal      No Known Allergies  Prior to Admission medications   Medication Sig Start Date End Date Taking? Authorizing Provider  aspirin 81 MG chewable tablet Chew 81 mg by mouth daily.   Yes [provider]  cyclobenzaprine (FLEXERIL) 5 MG tablet Take 5 mg by mouth at bedtime.   Yes [provider]  ferrous sulfate 325 (65 FE) MG tablet Take 65 mg by mouth daily with breakfast.   Yes [provider]  montelukast (SINGULAIR) 10 MG tablet Take 10 mg by mouth at bedtime.   Yes [provider]  Prenatal Vit-Fe Fumarate-FA (PRENATAL MULTIVITAMIN) TABS tablet Take 1 tablet by mouth daily at 12 noon. They have added iron and folate.   Yes [provider]  clonazePAM (KLONOPIN) 0.5 MG tablet Take 1 tablet (0.5 mg total) by mouth daily. 10/18/16 04/22/19  Minna Antis, MD  lamoTRIgine (LAMICTAL) 100 MG tablet Take 100 mg by mouth 2 (two) times daily.  04/22/19  [provider]  norethindrone-ethinyl estradiol (JUNEL FE,GILDESS FE,LOESTRIN FE) 1-20 MG-MCG tablet Take 1 tablet by mouth daily. 03/11/17 04/22/19  [provider]  propranolol ER (INDERAL LA) 60 MG 24 hr capsule Take 60 mg by mouth daily. 11/14/16 04/22/19  [provider]  SUMAtriptan (IMITREX) 100 MG tablet Take 1 tablet earliest onset of migraine.  May repeat once in 2 hours if headache persists or recurs.  Do not exceed 2 tablets in 24 hours. 03/18/17 04/22/19  Drema Dallas, DO  topiramate (TOPAMAX) 100 MG tablet TAKE 2 TABLETS BY MOUTH AT BEDTIME 12/13/17 04/22/19  Drema Dallas, DO      Social History: She  reports that she quit smoking about 8 years ago. She has never used smokeless tobacco. She reports previous alcohol use. She reports that she does not use drugs.  Family History: family history includes Diabetes in her mother; Diverticulitis in her maternal grandmother; Gestational diabetes in her sister; Heart disease in her maternal grandmother and mother; Kidney failure in her father.   Review of Systems: A full review of systems was performed and negative except as noted in the HPI.     O:  BP 125/76   Pulse 79   Temp 98.5 F (36.9 C) (Oral)   Resp 18   Ht 5\' 3"  (1.6 m)   Wt 121.1 kg  LMP 03/09/2019   BMI 47.30 kg/m  Results for orders placed or performed during the hospital encounter of 11/15/19 (from the past 48 hour(s))  Protein / creatinine ratio, urine   Collection Time: 11/15/19 11:32 PM  Result Value Ref Range   Creatinine, Urine 57 mg/dL   Total Protein, Urine 8 mg/dL   Protein Creatinine Ratio 0.14 0.00 - 0.15 mg/mg[Cre]  Comprehensive metabolic panel   Collection Time: 11/15/19 11:56 PM  Result Value Ref Range   Sodium 138 135 - 145 mmol/L   Potassium 3.4 (L) 3.5 - 5.1 mmol/L   Chloride 108 98 - 111 mmol/L   CO2 23 22 - 32 mmol/L    Glucose, Bld 104 (H) 70 - 99 mg/dL   BUN 7 6 - 20 mg/dL   Creatinine, Ser 7.12 (L) 0.44 - 1.00 mg/dL   Calcium 8.6 (L) 8.9 - 10.3 mg/dL   Total Protein 5.8 (L) 6.5 - 8.1 g/dL   Albumin 2.7 (L) 3.5 - 5.0 g/dL   AST 16 15 - 41 U/L   ALT 13 0 - 44 U/L   Alkaline Phosphatase 64 38 - 126 U/L   Total Bilirubin 0.5 0.3 - 1.2 mg/dL   GFR calc non Af Amer >60 >60 mL/min   GFR calc Af Amer >60 >60 mL/min   Anion gap 7 5 - 15  CBC on admission   Collection Time: 11/15/19 11:56 PM  Result Value Ref Range   WBC 8.7 4.0 - 10.5 K/uL   RBC 3.31 (L) 3.87 - 5.11 MIL/uL   Hemoglobin 9.7 (L) 12.0 - 15.0 g/dL   HCT 45.8 (L) 36 - 46 %   MCV 84.0 80.0 - 100.0 fL   MCH 29.3 26.0 - 34.0 pg   MCHC 34.9 30.0 - 36.0 g/dL   RDW 09.9 (H) 83.3 - 82.5 %   Platelets 157 150 - 400 K/uL   nRBC 0.0 0.0 - 0.2 %     Constitutional: NAD, AAOx3  HE/ENT: extraocular movements grossly intact, moist mucous membranes CV: RRR PULM: nl respiratory effort, CTABL     Abd: gravid, non-tender, non-distended, soft      Ext: Non-tender, Nonedematous   Psych: mood appropriate, speech normal Pelvic: deferred  Fetal  monitoring: Cat I Appropriate for GA Baseline:  Variability: moderate Accelerations: present x >2 Decelerations absent Time  Toco: none noted.    A/P: 34 y.o. [redacted]w[redacted]d here for antenatal surveillance for [facility]  Principle Diagnosis:     Stable labs, normal to mild range BPs.   Fetal Wellbeing: Reassuring Cat 1 tracing.  Reactive NST   Scheduled IOL 10/11 at 0001  D/c home stable, precautions reviewed, follow-up as scheduled.    Randa Ngo, CNM 11/16/2019  12:49 AM

## 2019-11-17 ENCOUNTER — Other Ambulatory Visit: Payer: Self-pay | Admitting: Obstetrics and Gynecology

## 2019-11-17 LAB — OB RESULTS CONSOLE HIV ANTIBODY (ROUTINE TESTING): HIV: NONREACTIVE

## 2019-11-17 LAB — OB RESULTS CONSOLE GBS: GBS: NEGATIVE

## 2019-11-17 LAB — OB RESULTS CONSOLE GC/CHLAMYDIA
Chlamydia: NEGATIVE
Gonorrhea: NEGATIVE

## 2019-11-17 NOTE — Progress Notes (Signed)
Dating: EDD: 12/14/19  by LMP: 03/09/19 and c/w [place] at 9+1 wks.   Preg c/b: 1. Obesity, BMI >40 2. CHTN, stopped meds with pregnancy; stopped propanolol early preg. Increasing BP 36wks, scheduled IOL 10/11 3. Mental health Dx including bipolar d/o and postpartum depression; Stopped taking Topiramate, propanolol, and Cymbalta after she had a positive pregnancy test 4. Asthma; Rescue inhaler - rarely needs; on Singulair.  5. Rubella and Varicella non-immune  Prenatal Labs: Blood type/Rh B Pos  Antibody screen neg  Rubella NON-Immune  Varicella NON-Immune  RPR NR  HBsAg Neg  HIV NR  GC neg  Chlamydia neg  Genetic screening negative  1 hour GTT 164  3 hour GTT  [phone]  GBS Pending, completed 11/17/19   Tdap: 09/22/19 Flu: Needs current season Infant feeding: breast Contraception: desires BTL, consent signed

## 2019-11-18 ENCOUNTER — Other Ambulatory Visit: Payer: Self-pay

## 2019-11-18 MED ORDER — SODIUM CHLORIDE 0.9 % IV SOLN
300.0000 mg | Freq: Once | INTRAVENOUS | Status: DC
  Filled 2019-11-18: qty 15

## 2019-11-18 NOTE — Addendum Note (Signed)
Addended by: Gustavo Lah on: 11/18/2019 01:45 PM   Modules accepted: Orders

## 2019-11-19 ENCOUNTER — Other Ambulatory Visit
Admission: RE | Admit: 2019-11-19 | Discharge: 2019-11-19 | Disposition: A | Discharge status: Home / Self Care | Payer: Medicaid Other | Source: Ambulatory Visit

## 2019-11-19 ENCOUNTER — Ambulatory Visit
Admission: RE | Admit: 2019-11-19 | Discharge: 2019-11-19 | Disposition: A | Discharge status: Home / Self Care | Payer: Medicaid Other | Source: Ambulatory Visit

## 2019-11-19 ENCOUNTER — Other Ambulatory Visit: Payer: Self-pay

## 2019-11-19 DIAGNOSIS — Z20822 Contact with and (suspected) exposure to covid-19: Secondary | ICD-10-CM | POA: Insufficient documentation

## 2019-11-19 DIAGNOSIS — D509 Iron deficiency anemia, unspecified: Secondary | ICD-10-CM | POA: Diagnosis not present

## 2019-11-19 DIAGNOSIS — Z01812 Encounter for preprocedural laboratory examination: Secondary | ICD-10-CM | POA: Diagnosis not present

## 2019-11-19 LAB — SARS CORONAVIRUS 2 (TAT 6-24 HRS): SARS Coronavirus 2: NEGATIVE

## 2019-11-19 MED ORDER — SODIUM CHLORIDE 0.9 % IV SOLN
300.0000 mg | Freq: Once | INTRAVENOUS | Status: AC
  Administered 2019-11-19: 300 mg via INTRAVENOUS
  Filled 2019-11-19: qty 15

## 2019-11-23 ENCOUNTER — Other Ambulatory Visit: Payer: Self-pay

## 2019-11-23 ENCOUNTER — Encounter: Payer: Self-pay | Admitting: Obstetrics and Gynecology

## 2019-11-23 ENCOUNTER — Inpatient Hospital Stay: Payer: Medicaid Other | Admitting: Registered Nurse

## 2019-11-23 ENCOUNTER — Inpatient Hospital Stay
Admission: EM | Admit: 2019-11-23 | Discharge: 2019-11-25 | DRG: 807 | Disposition: A | Discharge status: Home / Self Care | Payer: Medicaid Other | Attending: Obstetrics and Gynecology | Admitting: Obstetrics and Gynecology

## 2019-11-23 DIAGNOSIS — O99214 Obesity complicating childbirth: Secondary | ICD-10-CM | POA: Diagnosis present

## 2019-11-23 DIAGNOSIS — I10 Essential (primary) hypertension: Secondary | ICD-10-CM | POA: Diagnosis present

## 2019-11-23 DIAGNOSIS — Z3A37 37 weeks gestation of pregnancy: Secondary | ICD-10-CM

## 2019-11-23 DIAGNOSIS — Z23 Encounter for immunization: Secondary | ICD-10-CM | POA: Diagnosis not present

## 2019-11-23 DIAGNOSIS — O9952 Diseases of the respiratory system complicating childbirth: Secondary | ICD-10-CM | POA: Diagnosis present

## 2019-11-23 DIAGNOSIS — O1002 Pre-existing essential hypertension complicating childbirth: Secondary | ICD-10-CM | POA: Diagnosis present

## 2019-11-23 DIAGNOSIS — Z87891 Personal history of nicotine dependence: Secondary | ICD-10-CM

## 2019-11-23 DIAGNOSIS — D509 Iron deficiency anemia, unspecified: Secondary | ICD-10-CM | POA: Diagnosis present

## 2019-11-23 DIAGNOSIS — O10919 Unspecified pre-existing hypertension complicating pregnancy, unspecified trimester: Secondary | ICD-10-CM | POA: Diagnosis present

## 2019-11-23 DIAGNOSIS — J45909 Unspecified asthma, uncomplicated: Secondary | ICD-10-CM | POA: Diagnosis present

## 2019-11-23 DIAGNOSIS — O9902 Anemia complicating childbirth: Secondary | ICD-10-CM | POA: Diagnosis present

## 2019-11-23 DIAGNOSIS — O0990 Supervision of high risk pregnancy, unspecified, unspecified trimester: Secondary | ICD-10-CM

## 2019-11-23 LAB — CBC
HCT: 29.5 % — ABNORMAL LOW (ref 36.0–46.0)
Hemoglobin: 10 g/dL — ABNORMAL LOW (ref 12.0–15.0)
MCH: 29.2 pg (ref 26.0–34.0)
MCHC: 33.9 g/dL (ref 30.0–36.0)
MCV: 86.3 fL (ref 80.0–100.0)
Platelets: 166 10*3/uL (ref 150–400)
RBC: 3.42 MIL/uL — ABNORMAL LOW (ref 3.87–5.11)
RDW: 16.2 % — ABNORMAL HIGH (ref 11.5–15.5)
WBC: 9.6 10*3/uL (ref 4.0–10.5)
nRBC: 0 % (ref 0.0–0.2)

## 2019-11-23 LAB — PROTEIN / CREATININE RATIO, URINE
Creatinine, Urine: 186 mg/dL
Protein Creatinine Ratio: 0.14 mg/mg{Cre} (ref 0.00–0.15)
Total Protein, Urine: 26 mg/dL

## 2019-11-23 LAB — COMPREHENSIVE METABOLIC PANEL
ALT: 14 U/L (ref 0–44)
AST: 18 U/L (ref 15–41)
Albumin: 2.9 g/dL — ABNORMAL LOW (ref 3.5–5.0)
Alkaline Phosphatase: 80 U/L (ref 38–126)
Anion gap: 9 (ref 5–15)
BUN: 6 mg/dL (ref 6–20)
CO2: 21 mmol/L — ABNORMAL LOW (ref 22–32)
Calcium: 8.6 mg/dL — ABNORMAL LOW (ref 8.9–10.3)
Chloride: 108 mmol/L (ref 98–111)
Creatinine, Ser: 0.46 mg/dL (ref 0.44–1.00)
GFR, Estimated: >60 mL/min (ref >60)
Glucose, Bld: 78 mg/dL (ref 70–99)
Potassium: 3.2 mmol/L — ABNORMAL LOW (ref 3.5–5.1)
Sodium: 138 mmol/L (ref 135–145)
Total Bilirubin: 0.5 mg/dL (ref 0.3–1.2)
Total Protein: 5.9 g/dL — ABNORMAL LOW (ref 6.5–8.1)

## 2019-11-23 LAB — RPR: RPR Ser Ql: NONREACTIVE

## 2019-11-23 LAB — TYPE AND SCREEN
ABO/RH(D): B POS
Antibody Screen: NEGATIVE

## 2019-11-23 MED ORDER — PRENATAL MULTIVITAMIN CH
1.0000 | ORAL_TABLET | Freq: Every day | ORAL | Status: DC
  Administered 2019-11-24: 1 via ORAL
  Filled 2019-11-23: qty 1

## 2019-11-23 MED ORDER — MISOPROSTOL 25 MCG QUARTER TABLET
25.0000 ug | ORAL_TABLET | ORAL | Status: DC | PRN
  Administered 2019-11-23: 25 ug via BUCCAL
  Filled 2019-11-23: qty 1

## 2019-11-23 MED ORDER — MISOPROSTOL 25 MCG QUARTER TABLET
25.0000 ug | ORAL_TABLET | ORAL | Status: DC | PRN
  Administered 2019-11-23: 25 ug via VAGINAL
  Filled 2019-11-23: qty 1

## 2019-11-23 MED ORDER — SODIUM CHLORIDE 0.9 % IV SOLN: 5.0000 10*6.[IU] | Freq: Once | INTRAVENOUS | Status: DC

## 2019-11-23 MED ORDER — HYDRALAZINE HCL 20 MG/ML IJ SOLN: 10.0000 mg | INTRAMUSCULAR | Status: DC | PRN

## 2019-11-23 MED ORDER — ACETAMINOPHEN 325 MG PO TABS: 650.0000 mg | ORAL_TABLET | ORAL | Status: DC | PRN

## 2019-11-23 MED ORDER — ACETAMINOPHEN 325 MG PO TABS
650.0000 mg | ORAL_TABLET | ORAL | Status: DC | PRN
  Administered 2019-11-23 – 2019-11-24 (×4): 650 mg via ORAL
  Filled 2019-11-23 (×4): qty 2

## 2019-11-23 MED ORDER — LABETALOL HCL 5 MG/ML IV SOLN
40.0000 mg | INTRAVENOUS | Status: DC | PRN
  Filled 2019-11-23: qty 8

## 2019-11-23 MED ORDER — LIDOCAINE HCL (PF) 1 % IJ SOLN
INTRAMUSCULAR | Status: DC | PRN
  Administered 2019-11-23: 3 mL via SUBCUTANEOUS

## 2019-11-23 MED ORDER — OXYCODONE HCL 5 MG PO TABS
5.0000 mg | ORAL_TABLET | ORAL | Status: DC | PRN
  Administered 2019-11-24: 5 mg via ORAL
  Filled 2019-11-23: qty 1

## 2019-11-23 MED ORDER — BUPIVACAINE HCL (PF) 0.25 % IJ SOLN
INTRAMUSCULAR | Status: DC | PRN
  Administered 2019-11-23 (×2): 5 mL via EPIDURAL

## 2019-11-23 MED ORDER — FERROUS SULFATE 325 (65 FE) MG PO TABS
325.0000 mg | ORAL_TABLET | Freq: Two times a day (BID) | ORAL | Status: DC
  Administered 2019-11-23 – 2019-11-25 (×4): 325 mg via ORAL
  Filled 2019-11-23 (×4): qty 1

## 2019-11-23 MED ORDER — IBUPROFEN 600 MG PO TABS
600.0000 mg | ORAL_TABLET | Freq: Four times a day (QID) | ORAL | Status: DC
  Administered 2019-11-23 – 2019-11-25 (×7): 600 mg via ORAL
  Filled 2019-11-23 (×7): qty 1

## 2019-11-23 MED ORDER — AMMONIA AROMATIC IN INHA
RESPIRATORY_TRACT | Status: AC
  Filled 2019-11-23: qty 10

## 2019-11-23 MED ORDER — DIPHENHYDRAMINE HCL 25 MG PO CAPS: 25.0000 mg | ORAL_CAPSULE | Freq: Four times a day (QID) | ORAL | Status: DC | PRN

## 2019-11-23 MED ORDER — SIMETHICONE 80 MG PO CHEW
80.0000 mg | CHEWABLE_TABLET | ORAL | Status: DC | PRN
  Administered 2019-11-24: 80 mg via ORAL
  Filled 2019-11-23: qty 1

## 2019-11-23 MED ORDER — WITCH HAZEL-GLYCERIN EX PADS: 1.0000 "application " | MEDICATED_PAD | CUTANEOUS | Status: DC | PRN

## 2019-11-23 MED ORDER — LABETALOL HCL 5 MG/ML IV SOLN: 40.0000 mg | INTRAVENOUS | Status: DC | PRN

## 2019-11-23 MED ORDER — OXYTOCIN BOLUS FROM INFUSION
333.0000 mL | Freq: Once | INTRAVENOUS | Status: AC
  Administered 2019-11-23: 333 mL via INTRAVENOUS

## 2019-11-23 MED ORDER — FENTANYL CITRATE (PF) 100 MCG/2ML IJ SOLN: 50.0000 ug | INTRAMUSCULAR | Status: DC | PRN

## 2019-11-23 MED ORDER — LABETALOL HCL 5 MG/ML IV SOLN: 20.0000 mg | INTRAVENOUS | Status: DC | PRN

## 2019-11-23 MED ORDER — SOD CITRATE-CITRIC ACID 500-334 MG/5ML PO SOLN: 30.0000 mL | ORAL | Status: DC | PRN

## 2019-11-23 MED ORDER — LABETALOL HCL 5 MG/ML IV SOLN: 80.0000 mg | INTRAVENOUS | Status: DC | PRN

## 2019-11-23 MED ORDER — TERBUTALINE SULFATE 1 MG/ML IJ SOLN: 0.2500 mg | Freq: Once | INTRAMUSCULAR | Status: DC | PRN

## 2019-11-23 MED ORDER — LABETALOL HCL 5 MG/ML IV SOLN
20.0000 mg | INTRAVENOUS | Status: DC | PRN
  Administered 2019-11-23: 20 mg via INTRAVENOUS

## 2019-11-23 MED ORDER — VARICELLA VIRUS VACCINE LIVE 1350 PFU/0.5ML IJ SUSR
0.5000 mL | INTRAMUSCULAR | Status: AC | PRN
  Administered 2019-11-25: 0.5 mL via SUBCUTANEOUS
  Filled 2019-11-23 (×2): qty 0.5

## 2019-11-23 MED ORDER — MISOPROSTOL 200 MCG PO TABS
ORAL_TABLET | ORAL | Status: AC
  Filled 2019-11-23: qty 4

## 2019-11-23 MED ORDER — ZOLPIDEM TARTRATE 5 MG PO TABS: 5.0000 mg | ORAL_TABLET | Freq: Every evening | ORAL | Status: DC | PRN

## 2019-11-23 MED ORDER — LIDOCAINE-EPINEPHRINE (PF) 1.5 %-1:200000 IJ SOLN
INTRAMUSCULAR | Status: DC | PRN
  Administered 2019-11-23: 3 mL via EPIDURAL

## 2019-11-23 MED ORDER — BENZOCAINE-MENTHOL 20-0.5 % EX AERO
1.0000 "application " | INHALATION_SPRAY | CUTANEOUS | Status: DC | PRN
  Administered 2019-11-23: 1 via TOPICAL
  Filled 2019-11-23: qty 56

## 2019-11-23 MED ORDER — ONDANSETRON HCL 4 MG PO TABS
4.0000 mg | ORAL_TABLET | ORAL | Status: DC | PRN
  Filled 2019-11-23: qty 1

## 2019-11-23 MED ORDER — LACTATED RINGERS IV SOLN: INTRAVENOUS | Status: DC

## 2019-11-23 MED ORDER — OXYTOCIN-SODIUM CHLORIDE 30-0.9 UT/500ML-% IV SOLN
2.5000 [IU]/h | INTRAVENOUS | Status: DC
  Administered 2019-11-23: 2.5 [IU]/h via INTRAVENOUS
  Filled 2019-11-23: qty 500

## 2019-11-23 MED ORDER — LIDOCAINE HCL (PF) 1 % IJ SOLN
INTRAMUSCULAR | Status: AC
  Filled 2019-11-23: qty 30

## 2019-11-23 MED ORDER — MEASLES, MUMPS & RUBELLA VAC IJ SOLR
0.5000 mL | INTRAMUSCULAR | Status: AC | PRN
  Administered 2019-11-25: 0.5 mL via SUBCUTANEOUS
  Filled 2019-11-23 (×2): qty 0.5

## 2019-11-23 MED ORDER — FENTANYL 2.5 MCG/ML W/ROPIVACAINE 0.15% IN NS 100 ML EPIDURAL (ARMC)
EPIDURAL | Status: AC
  Filled 2019-11-23: qty 100

## 2019-11-23 MED ORDER — ONDANSETRON HCL 4 MG/2ML IJ SOLN: 4.0000 mg | Freq: Four times a day (QID) | INTRAMUSCULAR | Status: DC | PRN

## 2019-11-23 MED ORDER — ONDANSETRON HCL 4 MG/2ML IJ SOLN: 4.0000 mg | INTRAMUSCULAR | Status: DC | PRN

## 2019-11-23 MED ORDER — LACTATED RINGERS IV SOLN
500.0000 mL | INTRAVENOUS | Status: DC | PRN
  Administered 2019-11-23: 500 mL via INTRAVENOUS

## 2019-11-23 MED ORDER — LIDOCAINE HCL (PF) 1 % IJ SOLN: 30.0000 mL | INTRAMUSCULAR | Status: DC | PRN

## 2019-11-23 MED ORDER — COCONUT OIL OIL
1.0000 "application " | TOPICAL_OIL | Status: DC | PRN
  Administered 2019-11-23: 1 via TOPICAL
  Filled 2019-11-23: qty 120

## 2019-11-23 MED ORDER — SENNOSIDES-DOCUSATE SODIUM 8.6-50 MG PO TABS
2.0000 | ORAL_TABLET | ORAL | Status: DC
  Administered 2019-11-23 – 2019-11-24 (×2): 2 via ORAL
  Filled 2019-11-23 (×2): qty 2

## 2019-11-23 MED ORDER — DIBUCAINE (PERIANAL) 1 % EX OINT: 1.0000 "application " | TOPICAL_OINTMENT | CUTANEOUS | Status: DC | PRN

## 2019-11-23 MED ORDER — OXYTOCIN 10 UNIT/ML IJ SOLN
INTRAMUSCULAR | Status: AC
  Filled 2019-11-23: qty 2

## 2019-11-23 MED ORDER — NIFEDIPINE ER OSMOTIC RELEASE 30 MG PO TB24
30.0000 mg | ORAL_TABLET | Freq: Every day | ORAL | Status: DC
  Administered 2019-11-23 – 2019-11-24 (×2): 30 mg via ORAL
  Filled 2019-11-23 (×2): qty 1

## 2019-11-23 MED ORDER — PENICILLIN G POT IN DEXTROSE 60000 UNIT/ML IV SOLN: 3.0000 10*6.[IU] | INTRAVENOUS | Status: DC

## 2019-11-23 MED ORDER — OXYTOCIN-SODIUM CHLORIDE 30-0.9 UT/500ML-% IV SOLN
1.0000 m[IU]/min | INTRAVENOUS | Status: DC
  Administered 2019-11-23: 2 m[IU]/min via INTRAVENOUS
  Filled 2019-11-23: qty 500

## 2019-11-23 MED ORDER — FENTANYL 2.5 MCG/ML W/ROPIVACAINE 0.15% IN NS 100 ML EPIDURAL (ARMC)
EPIDURAL | Status: DC | PRN
Start: 2019-11-23 — End: 2019-11-23
  Administered 2019-11-23: 12 mL/h via EPIDURAL

## 2019-11-23 NOTE — Discharge Summary (Signed)
Obstetrical Discharge Summary  Patient Name: Melinda Mendoza DOB: January 19, 1986 MRN: [id number]  Date of Admission: 11/23/2019 Date of Delivery: 11/23/19 Delivered by: Heloise Ochoa CNM Date of Discharge: 11/25/2019  Primary OB: [facility] [id number] last menstrual period was 03/09/2019. EDC Estimated Date of Delivery: 12/14/19 Gestational Age at Delivery: [redacted]w[redacted]d   Antepartum complications:  1. Prepregnancy BMI 42 2. Chronic hypertension 3. History of preeclampsia with G2 4. Asthma, controlled with Singulair  5. Bipolar, history of postpartum depression after G1; stopped meds with positive pregnancy test 6. Resolved subchorionic hemorrhage  7. Rubella non-immune 8. Varicella non-immune 9. Iron deficiency anemia  Admitting Diagnosis: CHTN, induction of labor Secondary Diagnosis: SVD  Patient Active Problem List   Diagnosis Date Noted  . Chronic hypertension 11/25/2019  . NSVD (normal spontaneous vaginal delivery) 11/25/2019  . Medication exposure during first trimester of pregnancy   . Supervision of other high risk pregnancies, third trimester 05/13/2019  . High-tone pelvic floor dysfunction 08/27/2017  . Urinary incontinence, mixed 08/27/2017  . Adjustment disorder with anxiety 11/15/2016  . Reactive depression 11/15/2016  . Anterior dislocation of right shoulder 11/02/2015  . Bipolar affective disorder, currently depressed, moderate (HCC) 06/07/2015  . Morbid obesity due to excess calories (HCC) 03/04/2015  . Bipolar 2 disorder (HCC) 08/20/2013    Augmentation: AROM, Pitocin and Cytotec Complications: None Intrapartum complications/course: see delivery note Date of Delivery: 11/23/19 Delivered By: Heloise Ochoa CNM Delivery Type: spontaneous vaginal delivery Anesthesia: epidural Placenta: spontaneous Laceration: abrasions Episiotomy: none Newborn Data: Live born female "Pricilla Holm" Birth Weight:  3380g   7lb7.2oz APGAR: 8, 9  Newborn Delivery   Birth  date/time: 11/23/2019 13:58:00 Delivery type: Vaginal, Spontaneous      Postpartum Procedures: "Meds to Beds"  Edinburgh:  Edinburgh Postnatal Depression Scale Screening Tool 11/24/2019 11/23/2019  I have been able to laugh and see the funny side of things. 0 (No Data)  I have looked forward with enjoyment to things. 0 -  I have blamed myself unnecessarily when things went wrong. 1 -  I have been anxious or worried for no good reason. 0 -  I have felt scared or panicky for no good reason. 0 -  Things have been getting on top of me. 0 -  I have been so unhappy that I have had difficulty sleeping. 0 -  I have felt sad or miserable. 0 -  I have been so unhappy that I have been crying. 0 -  The thought of harming myself has occurred to me. 0 -  Edinburgh Postnatal Depression Scale Total 1 -      Post partum course:  Patient had an uncomplicated postpartum course.  By time of discharge on PPD#2, her pain was controlled on oral pain medications; she had appropriate lochia and was ambulating, voiding without difficulty and tolerating regular diet.  She was deemed stable for discharge to home.    Discharge Physical Exam:  BP (!) 141/98 (BP Location: Left Arm)   Pulse 83   Temp 98.6 F (37 C) (Oral)   Resp 20   Ht 5\' 3"  (1.6 m)   Wt 121.6 kg   LMP 03/09/2019   SpO2 100%   Breastfeeding Unknown   BMI 47.47 kg/m   General: NAD CV: RRR Pulm: CTABL, nl effort ABD: s/nd/nt, fundus firm and below the umbilicus Lochia: moderate Perineum: intact DVT Evaluation: LE non-ttp, no evidence of DVT on exam.  Hemoglobin  Date Value Ref Range Status  11/24/2019 10.3 (L) 12.0 - 15.0  g/dL Final   HGB  Date Value Ref Range Status  10/04/2013 11.1 (L) 12.0 - 16.0 g/dL Final   HCT  Date Value Ref Range Status  11/24/2019 29.8 (L) 36 - 46 % Final  10/04/2013 32.3 (L) 35.0 - 47.0 % Final     Disposition: stable, discharge to home. Baby Feeding: breastmilk Baby Disposition: home with  mom  Rh Immune globulin given: n/a Rubella vaccine given: non-immune, to be given prior to DC, ordered Varicella vaccine given: non-immune, to be given prior to DC, ordered Tdap vaccine given in AP or PP setting: 09/22/19 Flu vaccine given in AP or PP setting: ordered  Contraception: Planning BTL  Prenatal Labs:  Blood type/Rh B+  Antibody screen neg  Rubella Non-immune  Varicella Non-immune  RPR NR  HBsAg Neg  HIV NR  GC neg  Chlamydia neg  Genetic screening cfDNA negative, XY; AFP negative  1 hour GTT 164  3 hour GTT 93, 182, 133, 116  GBS negative      Plan:  Nanea Jared was discharged to home in good condition. Follow-up appointment with delivering provider in 6 weeks.  Discharge Medications: Allergies as of 11/25/2019      Reactions   Gabapentin Other (See Comments)   Weight gain   Tioconazole Other (See Comments)   burning      Medication List    STOP taking these medications   aspirin 81 MG chewable tablet   cyclobenzaprine 5 MG tablet Commonly known as: FLEXERIL   folic acid 1 MG tablet Commonly known as: FOLVITE     TAKE these medications   ferrous sulfate 325 (65 FE) MG tablet Take 65 mg by mouth daily with breakfast.   hydrochlorothiazide 25 MG tablet Commonly known as: HYDRODIURIL Take 1 tablet (25 mg total) by mouth daily.   montelukast 10 MG tablet Commonly known as: SINGULAIR Take 10 mg by mouth at bedtime.   NIFEdipine 60 MG 24 hr tablet Commonly known as: Procardia XL Take 1 tablet (60 mg total) by mouth daily.   prenatal multivitamin Tabs tablet Take 1 tablet by mouth daily at 12 noon. They have added iron and folate.        Follow-up Information    McVey, Prudencio Pair, CNM. Schedule an appointment as soon as possible for a visit on 11/27/2019.   Specialty: Obstetrics and Gynecology Why: First choice - appt with MD to also do a pre-op for BTL Second choice - appt with RM Contact information: [address] [place] [address] [phone]               Signed:  Cyril Mourning, CNM 11/25/2019 8:17 AM

## 2019-11-23 NOTE — Anesthesia Procedure Notes (Addendum)
Epidural Patient location during procedure: OB Start time: 11/23/2019 8:25 AM End time: 11/23/2019 8:40 AM  Staffing Anesthesiologist: Naomie Dean, MD Resident/CRNA: Junious Silk, CRNA Performed: resident/CRNA   Preanesthetic Checklist Completed: patient identified, IV checked, site marked, risks and benefits discussed, surgical consent, monitors and equipment checked, pre-op evaluation and timeout performed  Epidural Patient position: sitting Prep: ChloraPrep Patient monitoring: heart rate, continuous pulse ox and blood pressure Approach: midline Location: L3-L4 Injection technique: LOR saline  Needle:  Needle type: Tuohy  Needle gauge: 17 G Needle length: 9 cm and 9 Catheter type: closed end flexible Catheter size: 19 Gauge Test dose: negative and 1.5% lidocaine with Epi 1:200 K  Assessment Events: blood not aspirated, injection not painful, no injection resistance, no paresthesia and negative IV test  Additional Notes 1 attempt Pt. Evaluated and documentation done after procedure finished. Patient identified. Risks/Benefits/Options discussed with patient including but not limited to bleeding, infection, nerve damage, paralysis, failed block, incomplete pain control, headache, blood pressure changes, nausea, vomiting, reactions to medication both or allergic, itching and postpartum back pain. Confirmed with bedside nurse the patient's most recent platelet count. Confirmed with patient that they are not currently taking any anticoagulation, have any bleeding history or any family history of bleeding disorders. Patient expressed understanding and wished to proceed. All questions were answered. Sterile technique was used throughout the entire procedure. Please see nursing notes for vital signs. Test dose was given through epidural catheter and negative prior to continuing to dose epidural or start infusion. Warning signs of high block given to the patient including shortness  of breath, tingling/numbness in hands, complete motor block, or any concerning symptoms with instructions to call for help. Patient was given instructions on fall risk and not to get out of bed. All questions and concerns addressed with instructions to call with any issues or inadequate analgesia.   Patient tolerated the insertion well without immediate complications.Reason for block:procedure for pain

## 2019-11-23 NOTE — H&P (Signed)
OB History & Physical   History of Present Illness:  Chief Complaint: presents for scheduled IOL  HPI:  Melinda Mendoza is a 34 y.o. G39P2002 female at [redacted]w[redacted]d dated by LMP c/w [redacted]w[redacted]d ultrasound.  She presents to L&D for scheduled IOL for chronic hypertension.   She reports:  -active fetal movement -no leakage of fluid -no vaginal bleeding  Pregnancy Issues: 1. Prepregnancy BMI 42 2. Chronic hypertension 3. History of preeclampsia with G2 4. Asthma, controlled with Singulair  5. Bipolar, history of postpartum depression after G1; stopped meds with positive pregnancy test 6. Resolved subchorionic hemorrhage  7. Rubella non-immune 8. Varicella non-immune 9. Iron deficiency anemia  Maternal Medical History:   Past Medical History:  Diagnosis Date  . Anxiety   . Bipolar 1 disorder (HCC)   . Chronic pain   . Depression   . Hypertension   . Irregular heartbeat   . Migraine   . Sleep apnea     Past Surgical History:  Procedure Laterality Date  . CHOLECYSTECTOMY    . laproscopy for IUD removal      Allergies  Allergen Reactions  . Gabapentin Other (See Comments)    Weight gain  . Tioconazole Other (See Comments)    burning    Prior to Admission medications   Medication Sig Start Date End Date Taking? Authorizing Provider  aspirin 81 MG chewable tablet Chew 81 mg by mouth daily.   Yes [provider]  cyclobenzaprine (FLEXERIL) 5 MG tablet Take 5 mg by mouth at bedtime.   Yes [provider]  ferrous sulfate 325 (65 FE) MG tablet Take 65 mg by mouth daily with breakfast.   Yes [provider]  folic acid (FOLVITE) 1 MG tablet Take 1 mg by mouth daily. 05/21/19 05/20/20 Yes [provider]  montelukast (SINGULAIR) 10 MG tablet Take 10 mg by mouth at bedtime.   Yes [provider]  Prenatal Vit-Fe Fumarate-FA (PRENATAL MULTIVITAMIN) TABS tablet Take 1 tablet by mouth daily at 12 noon. They have added iron and folate.   Yes [provider]  clonazePAM (KLONOPIN) 0.5 MG tablet Take 1 tablet (0.5 mg total) by mouth daily. 10/18/16 04/22/19  Minna Antis, MD  lamoTRIgine (LAMICTAL) 100 MG tablet Take 100 mg by mouth 2 (two) times daily.  04/22/19  [provider]  norethindrone-ethinyl estradiol (JUNEL FE,GILDESS FE,LOESTRIN FE) 1-20 MG-MCG tablet Take 1 tablet by mouth daily. 03/11/17 04/22/19  [provider]  propranolol ER (INDERAL LA) 60 MG 24 hr capsule Take 60 mg by mouth daily. 11/14/16 04/22/19  [provider]  SUMAtriptan (IMITREX) 100 MG tablet Take 1 tablet earliest onset of migraine.  May repeat once in 2 hours if headache persists or recurs.  Do not exceed 2 tablets in 24 hours. 03/18/17 04/22/19  Drema Dallas, DO  topiramate (TOPAMAX) 100 MG tablet TAKE 2 TABLETS BY MOUTH AT BEDTIME 12/13/17 04/22/19  Drema Dallas, DO     Prenatal care site: [facility]   Social History: She  reports that she quit smoking about 8 years ago. She has never used smokeless tobacco. She reports previous alcohol use. She reports that she does not use drugs.  Family History: family history includes Diabetes in her mother; Diverticulitis in her maternal grandmother; Gestational diabetes in her sister; Heart disease in her maternal grandmother and mother; Kidney failure in her father.   Review of Systems: A full review of systems was performed and negative except as noted  in the HPI.    Physical Exam:  Vital Signs: BP 131/71   Pulse 81   Temp 98.4 F (36.9 C) (Oral)   Resp 18   Ht 5\' 3"  (1.6 m)   Wt 121.6 kg   LMP 03/09/2019   BMI 47.47 kg/m   General:   alert, cooperative, appears stated age and no distress  Skin:  normal and no rash or abnormalities  Neurologic:    Alert & oriented x 3  Lungs:   clear to auscultation bilaterally  Heart:   regular rate and rhythm, S1, S2 normal, no murmur, click, rub or gallop  Abdomen:  soft, non-tender; bowel sounds normal; no masses,  no  organomegaly  Pelvis:  Exam deferred.  FHT:  145 BPM  Presentations: cephalic  Cervix:  per RN 03/11/2019 [facility] at [phone]   Dilation: 2.5cm   Effacement: Long   Station:  -3   Consistency: soft   Position: posterior  Extremities: : non-tender, symmetric, +1 b/l LE edema bilaterally.      11/19/19 growth ultrasound: EFW=6lb11oz=3047g=54%, AFI=17.31cm  Results for orders placed or performed during the hospital encounter of 11/23/19 (from the past 24 hour(s))  CBC     Status: Abnormal   Collection Time: 11/23/19  1:07 AM  Result Value Ref Range   WBC 9.6 4.0 - 10.5 K/uL   RBC 3.42 (L) 3.87 - 5.11 MIL/uL   Hemoglobin 10.0 (L) 12.0 - 15.0 g/dL   HCT 01/23/20 (L) 36 - 46 %   MCV 86.3 80.0 - 100.0 fL   MCH 29.2 26.0 - 34.0 pg   MCHC 33.9 30.0 - 36.0 g/dL   RDW 84.1 (H) 66.0 - 63.0 %   Platelets 166 150 - 400 K/uL   nRBC 0.0 0.0 - 0.2 %  Comprehensive metabolic panel     Status: Abnormal   Collection Time: 11/23/19  1:07 AM  Result Value Ref Range   Sodium 138 135 - 145 mmol/L   Potassium 3.2 (L) 3.5 - 5.1 mmol/L   Chloride 108 98 - 111 mmol/L   CO2 21 (L) 22 - 32 mmol/L   Glucose, Bld 78 70 - 99 mg/dL   BUN 6 6 - 20 mg/dL   Creatinine, Ser 01/23/20 0.44 - 1.00 mg/dL   Calcium 8.6 (L) 8.9 - 10.3 mg/dL   Total Protein 5.9 (L) 6.5 - 8.1 g/dL   Albumin 2.9 (L) 3.5 - 5.0 g/dL   AST 18 15 - 41 U/L   ALT 14 0 - 44 U/L   Alkaline Phosphatase 80 38 - 126 U/L   Total Bilirubin 0.5 0.3 - 1.2 mg/dL   GFR, Estimated 1.09 >32 mL/min   Anion gap 9 5 - 15  Protein / creatinine ratio, urine     Status: None   Collection Time: 11/23/19  1:07 AM  Result Value Ref Range   Creatinine, Urine 186 mg/dL   Total Protein, Urine 26 mg/dL   Protein Creatinine Ratio 0.14 0.00 - 0.15 mg/mg[Cre]  Type and screen     Status: None   Collection Time: 11/23/19  1:07 AM  Result Value Ref Range   ABO/RH(D) B POS    Antibody Screen NEG    Sample Expiration      11/26/2019,2359 Performed at [facility], [address] [place], [place]     Pertinent Results:  Prenatal Labs: Blood type/Rh B+  Antibody screen neg  Rubella Nop-immune  Varicella Non-immune  RPR NR  HBsAg  Neg  HIV NR  GC neg  Chlamydia neg  Genetic screening cfDNA negative, XY; AFP negative  1 hour GTT 164  3 hour GTT 93, 182, 133, 116  GBS negative   FHT: FHR: 130 bpm, variability: moderate,  accelerations:  Present,  decelerations:  Absent Category/reactivity:  Category I TOCO: regular, every 1-3 minutes   Assessment:  Melinda Mendoza is a 34 y.o. G55P2002 female at [redacted]w[redacted]d with chronic hypertension.   Plan:  1. Admit to Labor & Delivery; consents reviewed and obtained  2. Fetal Well being  - Fetal Tracing: category I - GBS negative - Presentation: cephalic confirmed by bedside ultrasound   3. Routine OB: - Prenatal labs reviewed, as above - Rh positive - CBC & T&S on admit - Clear fluids, IVF  4. Induction of Labor: -  Contractions to be monitored with external toco in place -  Plan for induction with misoprostol then pitocin -  Plan for continuous fetal monitoring  -  Maternal pain control as desired: IVPM, regional anesthesia - Anticipate vaginal delivery  5. Post Partum Planning: - Infant feeding: breast - Contraception: BTL, Medicaid consents signed 09/08/2019  Genia Del, CNM 11/23/2019 7:04 AM ----- Genia Del Certified Nurse Midwife [facility], Department of OB/GYN [facility]

## 2019-11-23 NOTE — Lactation Note (Addendum)
This note was copied from a baby's chart. Lactation Consultation Note  Patient Name: Melinda Mendoza IDPOE'U Date: 11/23/2019 Reason for consult: Initial assessment;Early term 37-38.6wks  Assisted mom in comfortable position with pillow support with Pricilla Holm in cradle hold on right breast skin to skin.  Demonstrated hand expression of colostrum and sandwiching of breast.  He latched with minimal assistance and began strong, rhythmic sucking with occasional swallows.  He was pulling in his upper lip.  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  When mentioned to mom, she said that one of her other babies had a lip tie causing her really sore nipples.  She went to see a Advertising copywriter.  She never had it clipped or lazered.  Information sheet given on area dentists who do lip and tongue tie repairs and told her to mention to Pediatrician.  Mom denies any breast or nipple pain.  After 15 minutes of vigorous sucking, he came off the breast on his own.  Shortly after coming off the breast, he started rooting again.  Mom repositioned him on the left breast.  He was not sustaining the latch or sucking as strong on the second breast, but remained skin to skin letting him suck intermittently for another 15 minutes.  Discussed feeding cues encouraging mom to put Pricilla Holm to the breast whenever he demonstrated hunger cues.  Mom breast fed her 62 year old for 3 to 4 months and her 34 year old for 8 months when he weaned him self.  Hand out given on what to expect with breast feeding the first 4 days of life reviewing normal newborn stomach size, supply and demand, skin to skin, normal course of lactation and routine newborn feeding patterns.  Lactation [employer] given and reviewed.  Lactation name and number written on white board and encouraged to call with any questions, concerns or assistance.    Maternal Data Formula Feeding for Exclusion: No Has patient been taught Hand  Expression?: Yes Does the patient have breastfeeding experience prior to this delivery?: Yes  Feeding Feeding Type: Breast Fed  LATCH Score Latch: Repeated attempts needed to sustain latch, nipple held in mouth throughout feeding, stimulation needed to elicit sucking reflex.  Audible Swallowing: A few with stimulation  Type of Nipple: Everted at rest and after stimulation  Comfort (Breast/Nipple): Soft / non-tender  Hold (Positioning): Assistance needed to correctly position infant at breast and maintain latch.  LATCH Score: 7  Interventions Interventions: Breast feeding basics reviewed;Assisted with latch;Skin to skin;Breast massage;Hand express  Lactation Tools Discussed/Used WIC Program: Yes   Consult Status Consult Status: Follow-up Date: 11/23/19 Follow-up type: Call as needed    Louis Meckel 11/23/2019, 6:27 PM

## 2019-11-23 NOTE — Anesthesia Preprocedure Evaluation (Signed)
Anesthesia Evaluation  Patient identified by MRN, date of birth, ID band Patient awake    Reviewed: Allergy & Precautions, H&P , NPO status , Patient's Chart, lab work & pertinent test results  Airway Mallampati: III  TM Distance: >3 FB Neck ROM: full    Dental  (+) Teeth Intact   Pulmonary sleep apnea (doesnt wear CPAP) , former smoker,    Pulmonary exam normal        Cardiovascular hypertension, Normal cardiovascular exam     Neuro/Psych  Headaches, PSYCHIATRIC DISORDERS Anxiety Depression Bipolar Disorder    GI/Hepatic negative GI ROS, Neg liver ROS,   Endo/Other    Renal/GU      Musculoskeletal   Abdominal   Peds  Hematology negative hematology ROS (+)   Anesthesia Other Findings   Reproductive/Obstetrics                             Anesthesia Physical Anesthesia Plan  ASA: III  Anesthesia Plan: Epidural   Post-op Pain Management:    Induction:   PONV Risk Score and Plan:   Airway Management Planned:   Additional Equipment:   Intra-op Plan:   Post-operative Plan:   Informed Consent: I have reviewed the patients History and Physical, chart, labs and discussed the procedure including the risks, benefits and alternatives for the proposed anesthesia with the patient or authorized representative who has indicated his/her understanding and acceptance.     Dental Advisory Given  Plan Discussed with: Anesthesiologist and CRNA  Anesthesia Plan Comments:         Anesthesia Quick Evaluation

## 2019-11-23 NOTE — Progress Notes (Signed)
Labor Progress Note  Melinda Mendoza is a 34 y.o. G3P2002 at [redacted]w[redacted]d by LMP admitted for induction of labor due to Hypertension.  Subjective: very comfortable with epidural  Objective: BP (!) 157/85 (BP Location: Right Arm)   Pulse 72   Temp 98 F (36.7 C) (Oral)   Resp 16   Ht 5\' 3"  (1.6 m)   Wt 121.6 kg   LMP 03/09/2019   SpO2 97%   BMI 47.47 kg/m  Notable VS details: reviewed  Fetal Assessment: FHT:  FHR: 130 bpm, variability: moderate,  accelerations:  Present,  decelerations:  Present early decel noted.  Category/reactivity:  Category I UC:   Regular but not tracing well when pt on side. , every 2-3 minutes by palp, Pitocin at 41mu/min SVE:  7/90/-1 AROM performed, small amt blood tinged fluid noted.   Membrane status: AROM at 0911 Amniotic color:  Blood tinged.   Labs: Lab Results  Component Value Date   WBC 9.6 11/23/2019   HGB 10.0 (L) 11/23/2019   HCT 29.5 (L) 11/23/2019   MCV 86.3 11/23/2019   PLT 166 11/23/2019   Component     Latest Ref Rng & Units 11/15/2019 11/23/2019  Sodium     135 - 145 mmol/L  138  Potassium     3.5 - 5.1 mmol/L  3.2 (L)  Chloride     98 - 111 mmol/L  108  CO2     22 - 32 mmol/L  21 (L)  Glucose     70 - 99 mg/dL  78  BUN     6 - 20 mg/dL  6  Creatinine     01/23/2020 - 1.00 mg/dL  0.73  Calcium     8.9 - 10.3 mg/dL  8.6 (L)  Total Protein     6.5 - 8.1 g/dL  5.9 (L)  Albumin     3.5 - 5.0 g/dL  2.9 (L)  AST     15 - 41 U/L  18  ALT     0 - 44 U/L  14  Alkaline Phosphatase     38 - 126 U/L  80  Total Bilirubin     0.3 - 1.2 mg/dL  0.5  GFR, Estimated     >60 mL/min  >60  Anion gap     5 - 15  9  Creatinine, Urine     mg/dL 57 7.10  Total Protein, Urine     mg/dL 8 26  Protein Creatinine Ratio     0.00 - 0.15 mg/mgCre 0.14 0.14    Assessment / Plan:  IOL due to HTN at 37.0wks  Labor: progressing on Pitocin with AROM. Active labor now, repositioned to high fowlers position.  Preeclampsia:  labs stable and No sx  Pre-e Fetal Wellbeing:  Category I Pain Control:  Epidural I/D:  n/a Anticipated MOD:  NSVD  [address] Yenesis Even, CNM 11/23/2019, 12:21 PM

## 2019-11-23 NOTE — Progress Notes (Signed)
Labor Progress Note  Melinda Mendoza is a 34 y.o. G3P2002 at [redacted]w[redacted]d by LMP admitted for induction of labor due to Hypertension.  Subjective: feeling painful UCs this am at 0815, Epidural given and now comfortable.   Objective: BP (!) 152/91 (BP Location: Right Arm)   Pulse 73   Temp 98.2 F (36.8 C) (Oral)   Resp 18   Ht 5\' 3"  (1.6 m)   Wt 121.6 kg   LMP 03/09/2019   SpO2 98%   BMI 47.47 kg/m  Notable VS details: reviewed  Fetal Assessment: FHT:  FHR: 140 bpm, variability: moderate,  accelerations:  Present,  decelerations:  Absent Category/reactivity:  Category I UC:   regular, every 2-3 minutes, Pitocin at 73mu/min SVE:   3/50/-2, fetal head well applied. Confirmed cephalic presentation with bedside 3m. AROM performed, small amt blood tinged fluid noted.   Membrane status: AROM at 0911 Amniotic color:  Blood tinged.   Labs: Lab Results  Component Value Date   WBC 9.6 11/23/2019   HGB 10.0 (L) 11/23/2019   HCT 29.5 (L) 11/23/2019   MCV 86.3 11/23/2019   PLT 166 11/23/2019   Component     Latest Ref Rng & Units 11/15/2019 11/23/2019  Sodium     135 - 145 mmol/L  138  Potassium     3.5 - 5.1 mmol/L  3.2 (L)  Chloride     98 - 111 mmol/L  108  CO2     22 - 32 mmol/L  21 (L)  Glucose     70 - 99 mg/dL  78  BUN     6 - 20 mg/dL  6  Creatinine     01/23/2020 - 1.00 mg/dL  8.65  Calcium     8.9 - 10.3 mg/dL  8.6 (L)  Total Protein     6.5 - 8.1 g/dL  5.9 (L)  Albumin     3.5 - 5.0 g/dL  2.9 (L)  AST     15 - 41 U/L  18  ALT     0 - 44 U/L  14  Alkaline Phosphatase     38 - 126 U/L  80  Total Bilirubin     0.3 - 1.2 mg/dL  0.5  GFR, Estimated     >60 mL/min  >60  Anion gap     5 - 15  9  Creatinine, Urine     mg/dL 57 7.84  Total Protein, Urine     mg/dL 8 26  Protein Creatinine Ratio     0.00 - 0.15 mg/mgCre 0.14 0.14    Assessment / Plan:  IOL due to HTN at 37.0wks  Labor: progressing on Pitocin with AROM.  Preeclampsia:  labs stable and No sx  Pre-e Fetal Wellbeing:  Category I Pain Control:  Epidural I/D:  n/a Anticipated MOD:  NSVD  [address] Kendell Gammon, CNM 11/23/2019, 10:02 AM

## 2019-11-24 LAB — CBC
HCT: 29.8 % — ABNORMAL LOW (ref 36.0–46.0)
Hemoglobin: 10.3 g/dL — ABNORMAL LOW (ref 12.0–15.0)
MCH: 29.6 pg (ref 26.0–34.0)
MCHC: 34.6 g/dL (ref 30.0–36.0)
MCV: 85.6 fL (ref 80.0–100.0)
Platelets: 153 10*3/uL (ref 150–400)
RBC: 3.48 MIL/uL — ABNORMAL LOW (ref 3.87–5.11)
RDW: 16.8 % — ABNORMAL HIGH (ref 11.5–15.5)
WBC: 10 10*3/uL (ref 4.0–10.5)
nRBC: 0 % (ref 0.0–0.2)

## 2019-11-24 MED ORDER — NIFEDIPINE ER OSMOTIC RELEASE 30 MG PO TB24
60.0000 mg | ORAL_TABLET | Freq: Every day | ORAL | Status: DC
  Administered 2019-11-25: 60 mg via ORAL
  Filled 2019-11-24: qty 2

## 2019-11-24 MED ORDER — HYDROCHLOROTHIAZIDE 25 MG PO TABS
25.0000 mg | ORAL_TABLET | Freq: Every day | ORAL | Status: DC
  Administered 2019-11-24 – 2019-11-25 (×2): 25 mg via ORAL
  Filled 2019-11-24 (×2): qty 1

## 2019-11-24 MED ORDER — INFLUENZA VAC SPLIT QUAD 0.5 ML IM SUSY
0.5000 mL | PREFILLED_SYRINGE | INTRAMUSCULAR | Status: AC | PRN
  Administered 2019-11-25: 0.5 mL via INTRAMUSCULAR
  Filled 2019-11-24: qty 0.5

## 2019-11-24 NOTE — Anesthesia Postprocedure Evaluation (Signed)
Anesthesia Post Note  Patient: Melinda Mendoza  Procedure(s) Performed: AN AD HOC LABOR EPIDURAL  Patient location during evaluation: Mother Baby Anesthesia Type: Epidural Level of consciousness: awake and alert Pain management: pain level controlled Vital Signs Assessment: post-procedure vital signs reviewed and stable Respiratory status: spontaneous breathing, nonlabored ventilation and respiratory function stable Cardiovascular status: stable Postop Assessment: no headache, no backache and epidural receding Anesthetic complications: no   No complications documented.   Last Vitals:  Vitals:   11/24/19 0329 11/24/19 0821  BP: (!) 148/103 135/80  Pulse: 94 67  Resp:  18  Temp:  37.1 C  SpO2:  99%    Last Pain:  Vitals:   11/24/19 0821  TempSrc: Oral  PainSc:                  [name]

## 2019-11-24 NOTE — Lactation Note (Signed)
This note was copied from a baby's chart. Lactation Consultation Note  Patient Name: Melinda Mendoza IWOEH'O Date: 11/24/2019 Reason for consult: Follow-up assessment;Early term 67-38.6wks  Lactation follow-up. Baby continues to BF however has been sleepy and spitty overnight; BF attempted frequently. Mom knowledgeable in hand expression to encourage latch, and stimulation milk production.  Mom was attempting a feed with LC in the room, baby content lying next to mom, latched with occasional swallows with stimulation and breast massage. LC reviewed normal newborn behaviors, stomach size and feeding frequency. Discussed the possibility of having mucous (being spitty) may interfere with his desire to eat. Provided alternative methods of expressing milk to encourage continued onset of adequate milk production: hand expression, hand pump, use of EBP.  Mom asks for replacement pacifier, stating she has already been given one but couldn't locate it. [facility] student provided parents with education and cautioned against pacifier use early in breastfeeding journey. Mom again requested a pacifier and it was given. Plan for baby to receive circumcision later this afternoon- parents provided with education and impact that this may have on BF behaviors this afternoon.  Parents verbalize understanding. Encouraged to call lactation with questions or for BF assistance.  Maternal Data Formula Feeding for Exclusion: No Has patient been taught Hand Expression?: Yes Does the patient have breastfeeding experience prior to this delivery?: Yes  Feeding Feeding Type: Breast Fed  LATCH Score Latch: Grasps breast easily, tongue down, lips flanged, rhythmical sucking.  Audible Swallowing: A few with stimulation  Type of Nipple: Everted at rest and after stimulation  Comfort (Breast/Nipple): Soft / non-tender  Hold (Positioning): No assistance needed to correctly position infant at breast.  LATCH Score:  9  Interventions Interventions: Breast feeding basics reviewed  Lactation Tools Discussed/Used     Consult Status Consult Status: Follow-up Date: 11/24/19 Follow-up type: Call as needed    Melinda Mendoza 11/24/2019, 9:54 AM

## 2019-11-24 NOTE — Consult Note (Signed)
Brief Pharmacy Note  Pharmacy has been consulted for our anti-hypertensive meds to beds program.   Patient has agreed to the program, and is aware she will be responsible for her regular outpatient prescription copay.   The patient's preferred pharmacy in Epic has been changed to [facility] employee pharmacy (so the electronic prescriptions can be sent there instead of their prior designated pharmacy.)    The electronic rx must be sent to employee pharmacy by 4:30pm the day of discharge for the meds to beds delivery to be possible.  Thank you for including pharmacy in this patient's care.  Albina Billet, PharmD, BCPS Clinical Pharmacist 11/24/2019 9:40 AM

## 2019-11-24 NOTE — Progress Notes (Signed)
Ch visited with pt as part of routine rounding. Pt's friend at bedside. Ch checked about baby boy, and Pt said he was doing fine, two days old. Pt did not need any other support at this time. Ch let her know about anytime chaplain availability for support upon request to RN.

## 2019-11-24 NOTE — Progress Notes (Addendum)
Post Partum Day 1  Subjective: no complaints, up ad lib, voiding and tolerating PO  Doing well, no concerns. Ambulating without difficulty, pain managed with PO meds, tolerating regular diet, and voiding without difficulty.   No fever/chills, chest pain, shortness of breath, nausea/vomiting, or leg pain. No nipple or breast pain. No headache, visual changes, or RUQ/epigastric pain.  Objective: BP 135/80 (BP Location: Left Arm)   Pulse 67   Temp 98.7 F (37.1 C) (Oral)   Resp 18   Ht 5\' 3"  (1.6 m)   Wt 121.6 kg   LMP 03/09/2019   SpO2 99%   Breastfeeding Unknown   BMI 47.47 kg/m    Physical Exam:  General: alert, cooperative and no distress Breasts: soft/nontender CV: RRR Pulm: nl effort, CTABL Abdomen: soft, non-tender, active bowel sounds Extremities: 2+/2+ pitting edema in lower extremities.  Uterine Fundus: firm Perineum: minimal edema, intact Lochia: appropriate DVT Evaluation: No evidence of DVT seen on physical exam.  Recent Labs    11/23/19 0107 11/24/19 0616  HGB 10.0* 10.3*  HCT 29.5* 29.8*  WBC 9.6 10.0  PLT 166 153    Assessment/Plan: 34 y.o. G3P3003 postpartum day # 1  -Continue routine postpartum care -Lactation consult PRN for breastfeeding -Chronic hypertension - Had a severe range b/p last night, treated with IV labetalol.  Started on PO Procardia.  Will start HCTZ in addition to help with edema.   -Discussed contraceptive options including implant, IUDs hormonal and non-hormonal, injection, pills/ring/patch, condoms, and NFP.  Desires BTL -Iron deficiency anemia r/t to normal physiologic changes of pregnancy - hemodynamically stable and asymptomatic; start PO ferrous sulfate BID with stool softeners  -Immunization status: all immunizations up to date  Disposition: Continue inpatient postpartum care, discharge home tomorrow    LOS: 1 day   01/24/20, CNM 11/24/2019, 9:16 AM   ----- 01/24/2020  Certified Nurse Midwife [facility]

## 2019-11-25 ENCOUNTER — Other Ambulatory Visit: Payer: Self-pay | Admitting: Certified Nurse Midwife

## 2019-11-25 DIAGNOSIS — I10 Essential (primary) hypertension: Secondary | ICD-10-CM | POA: Diagnosis present

## 2019-11-25 LAB — SURGICAL PATHOLOGY

## 2019-11-25 MED ORDER — HYDROCHLOROTHIAZIDE 25 MG PO TABS: 25.0000 mg | ORAL_TABLET | Freq: Every day | ORAL | 1 refills | Status: DC

## 2019-11-25 MED ORDER — NIFEDIPINE ER OSMOTIC RELEASE 60 MG PO TB24: 60.0000 mg | ORAL_TABLET | Freq: Every day | ORAL | 11 refills | Status: DC

## 2019-11-25 NOTE — Consult Note (Signed)
Brief Pharmacy Note  Pharmacy has been consulted for our anti-hypertensive meds to beds program.   Patient meds have been delivered bedside and patient has been counseled  Thank you for all involved in including pharmacy in this patient's care.  Albina Billet, PharmD, BCPS Clinical Pharmacist 11/25/2019 9:36 AM

## 2019-12-03 DIAGNOSIS — D509 Iron deficiency anemia, unspecified: Secondary | ICD-10-CM | POA: Diagnosis present

## 2019-12-03 NOTE — Progress Notes (Signed)
I think I was able to add the diagnosis of iron deficiency anemia.  Let me know if it didn't work.    Thank you,  Margaretmary Eddy, CNM

## 2019-12-14 ENCOUNTER — Inpatient Hospital Stay: Admit: 2019-12-14 | Payer: Self-pay

## 2019-12-21 ENCOUNTER — Other Ambulatory Visit: Payer: Medicaid Other

## 2020-01-05 NOTE — H&P (Signed)
Melinda Mendoza is a 34 y.o. female. Pt is here for  elective sterilization  L/S - falope rings  Pt has signed medicaid consent  08/2019 . S/p cholecystectomy    Past Medical History:  has a past medical history of Anxiety, Bipolar disorder (CMS-HCC), Depression, Headache, classical migraine, Migraine headache, Obesity (BMI 30-39.9), unspecified, Pregnancy induced hypertension, Sleep apnea, and Spine pain, unspecified (06/27/2017).  Past Surgical History:  has a past surgical history that includes Cholecystectomy and IUD removal. Family History: family history includes Anxiety in her mother; Bipolar disorder in her mother; Breast cancer in her maternal grandmother and mother; Cancer in her maternal grandmother and mother; Coronary Artery Disease (Blocked arteries around heart) in her mother and paternal grandfather; Deep vein thrombosis (DVT or abnormal blood clot formation) in her mother; Depression in her mother; Diabetes in her maternal grandfather and mother; Diabetes type II in her maternal grandfather, maternal grandmother, and mother; Glaucoma in her maternal grandfather; High blood pressure (Hypertension) in her maternal grandmother and mother; Hyperlipidemia (Elevated cholesterol) in her mother; Kidney disease in her father; Kidney failure in her father; Liver disease in her mother; Myocardial Infarction (Heart attack) in her maternal grandfather and mother; No Known Problems in her daughter; Obesity in her mother; Osteoarthritis in her maternal grandmother; Stroke in her father, maternal grandfather, and mother. Social History:  reports that she quit smoking about 8 years ago. Her smoking use included cigarettes. She has never used smokeless tobacco. She reports current alcohol use. She reports that she does not use drugs.  OB/GYN History:  OB History   G3P3  Allergies: is allergic to gabapentin and monistat 1 (tioconazole) [tioconazole]. Medications:  Current Outpatient Medications:  .   acetaminophen (TYLENOL) 500 MG tablet, Take by mouth, Disp: , Rfl:  .  blood pressure test kit-large Kit, Use 1 each once daily, Disp: 1 each, Rfl: 0 .  hydroCHLOROthiazide (HYDRODIURIL) 50 MG tablet, Take 1 tablet (50 mg total) by mouth once daily, Disp: 30 tablet, Rfl: 11 .  ibuprofen (MOTRIN) 800 MG tablet, Take 800 mg by mouth every 6 (six) hours as needed for Pain, Disp: , Rfl:  .  montelukast (SINGULAIR) 10 mg tablet, Take 1 tablet (10 mg total) by mouth once daily For allergies - take while taking zyrtec at night, Disp: 90 tablet, Rfl: 3 .  niFEdipine (PROCARDIA-XL) 60 MG (OSM) XL tablet, Take by mouth, Disp: , Rfl:  .  PNV no.153/FA/om3/dha/epa/fish (PRENATAL GUMMIES ORAL), Take by mouth, Disp: , Rfl:  .  albuterol 90 mcg/actuation inhaler, Inhale 2 inhalations into the lungs every 6 (six) hours as needed, Disp: 1 Inhaler, Rfl: 1 .  cephalexin (KEFLEX) 500 MG capsule, TAKE 1 CAPSULE BY MOUTH THREE TIMES DAILY FOR 5 DAYS (Patient not taking: Reported on 12/03/2019), Disp: , Rfl:  .  cyclobenzaprine (FLEXERIL) 5 MG tablet, Take 1 tablet (5 mg total) by mouth 3 (three) times daily as needed for Muscle spasms (Patient not taking: Reported on 12/03/2019  ), Disp: 90 tablet, Rfl: 1 .  pyridoxine, vitamin B6, (VITAMIN B-6) 25 MG tablet, Take 1 tablet (25 mg total) by mouth 3 (three) times a day (Patient not taking: Reported on 12/03/2019  ), Disp: 180 tablet, Rfl: 1  Review of Systems: General:                      No fatigue or weight loss Eyes:  No vision changes Ears:                            No hearing difficulty Respiratory:                No cough or shortness of breath Pulmonary:                  No asthma or shortness of breath Cardiovascular:           No chest pain, palpitations, dyspnea on exertion Gastrointestinal:          No abdominal bloating, chronic diarrhea, constipations, masses, pain or hematochezia Genitourinary:             No hematuria, dysuria,  abnormal vaginal discharge, pelvic pain, Menometrorrhagia Lymphatic:                   No swollen lymph nodes Musculoskeletal:         No muscle weakness Neurologic:                  No extremity weakness, syncope, seizure disorder Psychiatric:                  No history of depression, delusions or suicidal/homicidal ideation  Exam:01/06/20  Vitals:  12/11/19 1500 BP: 112/75 Pulse: 102   Body mass index is 43.2 kg/m.  WDWN white/ black female in NAD   Lungs: CTA  CV : RRR without murmur   Breast: exam done in sitting and lying position : No dimpling or retraction, no dominant mass, no spontaneous discharge, no axillary adenopathy Neck:  no thyromegaly Abdomen: soft , no mass, normal active bowel sounds,  non-tender, no rebound tenderness Pelvic: tanner stage 5 ,  External genitalia: vulva /labia no lesions Urethra: no prolapse Vagina: normal physiologic d/c Cervix: no lesions, no cervical motion tenderness   Uterus: normal size shape and contour, non-tender Adnexa: no mass,  non-tender   Rectovaginal: no mass heme negative  Impression:  The encounter diagnosis was General counseling and advice for contraceptive management.    Plan:  After thorough discussion the pt has elected for permanent sterilzation . L/S BTL  With falope rings .  Risk of failure 1/300 discussed  Benefits and risks to surgery: The proposed benefit of the surgery has been discussed with the patient. The possible risks include, but are not limited to: organ injury to the bowel , bladder, ureters, and major blood vessels and nerves. There is a possibility of additional surgeries resulting from these injuries. There is also the risk of blood transfusion and the need to receive blood products during or after the procedure which may rarely lead to HIV or Hepatitis C infection. There is a risk of developing a deep venous thrombosis or a pulmonary embolism . There is the possibility of wound  infection and also anesthetic complications, even the rare possibility of death. The patient understands these risks and wishes to proceed. All questions have been answered  No orders of the defined types were placed in this encounter.     Caroline Sauger, MD

## 2020-01-15 ENCOUNTER — Encounter
Admission: RE | Admit: 2020-01-15 | Discharge: 2020-01-15 | Disposition: A | Discharge status: Home / Self Care | Payer: Medicaid Other | Source: Ambulatory Visit | Attending: Obstetrics and Gynecology | Admitting: Obstetrics and Gynecology

## 2020-01-15 ENCOUNTER — Other Ambulatory Visit: Payer: Self-pay

## 2020-01-15 DIAGNOSIS — Z01812 Encounter for preprocedural laboratory examination: Secondary | ICD-10-CM | POA: Diagnosis not present

## 2020-01-15 HISTORY — DX: Unspecified asthma, uncomplicated: J45.909

## 2020-01-15 HISTORY — DX: Gastro-esophageal reflux disease without esophagitis: K21.9

## 2020-01-15 NOTE — Patient Instructions (Signed)
INSTRUCTIONS FOR SURGERY     Your surgery is scheduled for:   Friday, December 12TH     To find out your arrival time for the day of surgery,          please call [phone] between 1 pm and 3 pm on :  Thursday, December 11TH     When you arrive for surgery, report to the REGISTRATION DESK ON THE FIRST FLOOR. WHEN     FINISHED WITH REGISTERING, PLEASE GO DIRECTLY TO THE SECOND FLOOR SIGN-IN      DESK. SECRETARY WILL HOOK UP TEXT MESSAGING FOR YOU.    REMEMBER: Instructions that are not followed completely may result in serious medical risk,  up to and including death, or upon the discretion of your surgeon and anesthesiologist,            your surgery may need to be rescheduled.  __X__ 1. Do not eat food after midnight the night before your procedure.                    No gum, candy, lozenger, tic tacs, tums or hard candies.                  ABSOLUTELY NOTHING SOLID IN YOUR MOUTH AFTER MIDNIGHT                    You may drink unlimited clear liquids up to 2 hours before you are scheduled to arrive for surgery.                   Do not drink anything within those 2 hours unless you need to take medicine, then take the                   smallest amount you need.  Clear liquids include:  water, apple juice without pulp,                   any flavor Gatorade, Black coffee, black tea.  Sugar may be added but no dairy/ honey /lemon.                        Broth and jello is not considered a clear liquid.  __x__  2. On the morning of surgery, please brush your teeth with toothpaste and water. You may rinse with                  mouthwash if you wish but DO NOT SWALLOW TOOTHPASTE OR MOUTHWASH  __X___3. NO alcohol for 24 hours before or after surgery.  __x___ 4.  Do NOT smoke or use e-cigarettes for 24 HOURS PRIOR TO SURGERY.                      DO NOT Use any chewable tobacco products for at least 6 hours prior to surgery.  __x___  5. If you start any new medication after this appointment and prior to surgery, please                   Bring it with you on the  day of surgery.  ___x__ 6. Notify your doctor if there is any change in your medical condition, such as fever, infection, vomitting,                   Diarrhea or any open sores.  __x___ 7.  USE the CHG SOAP as instructed, the night before surgery and the day of surgery.                   Once you have washed with this soap, do NOT use any of the following: Powders, perfumes                    or lotions. Please do not wear make up, hairpins, clips or nail polish. You MAY   wear deodorant.                   Women need to shave 48 hours prior to surgery.  DO NOT wear ANY jewelry on the day of                    surgery. If there are rings that are too tight to remove easily, please address this prior to                   the surgery day. Piercings need to be removed.                                                                     NO METAL ON YOUR BODY.                    Do NOT bring any valuables.  If you came to Pre-Admit testing then you will not need license,                     insurance card or credit card.  If you will be staying overnight, please either leave your things in                     the car or have your family be responsible for these items.                     [facility] [facility] IS NOT RESPONSIBLE FOR BELONGINGS OR VALUABLES.  ___X__ 8. DO NOT wear contact lenses on surgery day.  You may not have dentures,                     Hearing aides, contacts or glasses in the operating room. These items can be                    Placed in the Recovery Room to receive immediately after surgery.  __x___ 9. IF YOU ARE SCHEDULED TO GO HOME ON THE SAME DAY, YOU MUST                   Have someone to drive you home and to stay with you  for the first 24 hours.                    Have an arrangement prior to arriving on  surgery day.  ___x__ 10. Take the following  medications on the morning of surgery with a sip of water:                              1. ALBUTEROL INHALER                     2.                      __X___ 11.  Follow any instructions provided to you by your surgeon.                        Such as enema, clear liquid bowel prep                     PLEASE DRINK THE PRESURGICAL CARBOHYDRATE DRINK ON THE                       MORNING OF SURGERY. HAVE IT COMPLETED BY 2 HOURS PRIOR                       TO ARRIVAL AT THE HOSPITAL.  __X__  12. STOP ALL ASPIRIN PRODUCTS AS OF December 3RD                       THIS INCLUDES BC POWDERS / GOODIES POWDER  __x___ 13. STOP Anti-inflammatories as of:DECEMBER 3RD                       This includes IBUPROFEN / MOTRIN / ADVIL / ALEVE/ NAPROXYN                    YOU MAY TAKE TYLENOL ANY TIME PRIOR TO SURGERY.  __X___ 14.  Stop supplements until after surgery.                     This includes: PRENATAL VITAMINS                ___x___17.  Continue to take the following medications but do not take on the morning of surgery:                          HYDROCHLORATHIAZIDE  ___X___18.  Wear clean and comfortable clothing to the hospital.                 WEAR STRETCHY OR LOOSE FITTING PANTS TO THE HOSPITAL.

## 2020-01-20 ENCOUNTER — Other Ambulatory Visit
Admission: RE | Admit: 2020-01-20 | Discharge: 2020-01-20 | Disposition: A | Discharge status: Home / Self Care | Payer: Medicaid Other | Source: Ambulatory Visit | Attending: Obstetrics and Gynecology | Admitting: Obstetrics and Gynecology

## 2020-01-20 ENCOUNTER — Other Ambulatory Visit: Payer: Self-pay

## 2020-01-20 DIAGNOSIS — Z01812 Encounter for preprocedural laboratory examination: Secondary | ICD-10-CM | POA: Diagnosis not present

## 2020-01-20 DIAGNOSIS — Z20822 Contact with and (suspected) exposure to covid-19: Secondary | ICD-10-CM | POA: Insufficient documentation

## 2020-01-20 LAB — TYPE AND SCREEN
ABO/RH(D): B POS
Antibody Screen: NEGATIVE
Extend sample reason: UNDETERMINED

## 2020-01-20 LAB — CBC
HCT: 39.3 % (ref 36.0–46.0)
Hemoglobin: 12.8 g/dL (ref 12.0–15.0)
MCH: 28.2 pg (ref 26.0–34.0)
MCHC: 32.6 g/dL (ref 30.0–36.0)
MCV: 86.6 fL (ref 80.0–100.0)
Platelets: 219 10*3/uL (ref 150–400)
RBC: 4.54 MIL/uL (ref 3.87–5.11)
RDW: 14.5 % (ref 11.5–15.5)
WBC: 7.2 10*3/uL (ref 4.0–10.5)
nRBC: 0 % (ref 0.0–0.2)

## 2020-01-20 LAB — BASIC METABOLIC PANEL
Anion gap: 10 (ref 5–15)
BUN: 10 mg/dL (ref 6–20)
CO2: 25 mmol/L (ref 22–32)
Calcium: 9.2 mg/dL (ref 8.9–10.3)
Chloride: 105 mmol/L (ref 98–111)
Creatinine, Ser: 0.83 mg/dL (ref 0.44–1.00)
GFR, Estimated: >60 mL/min (ref >60)
Glucose, Bld: 74 mg/dL (ref 70–99)
Potassium: 3.9 mmol/L (ref 3.5–5.1)
Sodium: 140 mmol/L (ref 135–145)

## 2020-01-21 LAB — SARS CORONAVIRUS 2 (TAT 6-24 HRS): SARS Coronavirus 2: NEGATIVE

## 2020-01-22 ENCOUNTER — Other Ambulatory Visit: Payer: Self-pay

## 2020-01-22 ENCOUNTER — Ambulatory Visit: Payer: Medicaid Other | Admitting: Registered Nurse

## 2020-01-22 ENCOUNTER — Ambulatory Visit
Admission: RE | Admit: 2020-01-22 | Discharge: 2020-01-22 | Disposition: A | Discharge status: Home / Self Care | Payer: Medicaid Other | Attending: Obstetrics and Gynecology | Admitting: Obstetrics and Gynecology

## 2020-01-22 ENCOUNTER — Encounter: Payer: Self-pay | Admitting: Obstetrics and Gynecology

## 2020-01-22 ENCOUNTER — Encounter
Admission: RE | Disposition: A | Discharge status: Home / Self Care | Payer: Self-pay | Source: Home / Self Care | Attending: Obstetrics and Gynecology

## 2020-01-22 DIAGNOSIS — Z79899 Other long term (current) drug therapy: Secondary | ICD-10-CM | POA: Diagnosis not present

## 2020-01-22 DIAGNOSIS — Z87891 Personal history of nicotine dependence: Secondary | ICD-10-CM | POA: Insufficient documentation

## 2020-01-22 DIAGNOSIS — Z302 Encounter for sterilization: Secondary | ICD-10-CM | POA: Insufficient documentation

## 2020-01-22 HISTORY — PX: LAPAROSCOPIC TUBAL LIGATION: SHX1937

## 2020-01-22 LAB — POCT PREGNANCY, URINE: Preg Test, Ur: NEGATIVE

## 2020-01-22 SURGERY — LIGATION, FALLOPIAN TUBE, LAPAROSCOPIC
Anesthesia: General | Laterality: Bilateral

## 2020-01-22 MED ORDER — PROPOFOL 10 MG/ML IV BOLUS
INTRAVENOUS | Status: AC
  Filled 2020-01-22: qty 20

## 2020-01-22 MED ORDER — PROPOFOL 10 MG/ML IV BOLUS
INTRAVENOUS | Status: DC | PRN
  Administered 2020-01-22: 200 mg via INTRAVENOUS
  Administered 2020-01-22: 50 mg via INTRAVENOUS

## 2020-01-22 MED ORDER — DEXAMETHASONE SODIUM PHOSPHATE 10 MG/ML IJ SOLN
INTRAMUSCULAR | Status: DC | PRN
  Administered 2020-01-22: 10 mg via INTRAVENOUS

## 2020-01-22 MED ORDER — ROCURONIUM BROMIDE 10 MG/ML (PF) SYRINGE
PREFILLED_SYRINGE | INTRAVENOUS | Status: AC
  Filled 2020-01-22: qty 10

## 2020-01-22 MED ORDER — ORAL CARE MOUTH RINSE: 15.0000 mL | Freq: Once | OROMUCOSAL | Status: AC

## 2020-01-22 MED ORDER — FAMOTIDINE 20 MG PO TABS
20.0000 mg | ORAL_TABLET | Freq: Once | ORAL | Status: AC
  Administered 2020-01-22: 20 mg via ORAL

## 2020-01-22 MED ORDER — CHLORHEXIDINE GLUCONATE 0.12 % MT SOLN
15.0000 mL | Freq: Once | OROMUCOSAL | Status: AC
  Administered 2020-01-22: 15 mL via OROMUCOSAL

## 2020-01-22 MED ORDER — KETOROLAC TROMETHAMINE 30 MG/ML IJ SOLN
INTRAMUSCULAR | Status: AC
  Filled 2020-01-22: qty 1

## 2020-01-22 MED ORDER — MIDAZOLAM HCL 2 MG/2ML IJ SOLN
INTRAMUSCULAR | Status: DC | PRN
  Administered 2020-01-22: 2 mg via INTRAVENOUS

## 2020-01-22 MED ORDER — ONDANSETRON HCL 4 MG PO TABS: 8.0000 mg | ORAL_TABLET | Freq: Once | ORAL | Status: DC

## 2020-01-22 MED ORDER — EPHEDRINE 5 MG/ML INJ
INTRAVENOUS | Status: AC
  Filled 2020-01-22: qty 10

## 2020-01-22 MED ORDER — ROCURONIUM BROMIDE 100 MG/10ML IV SOLN
INTRAVENOUS | Status: DC | PRN
  Administered 2020-01-22: 60 mg via INTRAVENOUS

## 2020-01-22 MED ORDER — OXYCODONE-ACETAMINOPHEN 5-325 MG PO TABS
1.0000 | ORAL_TABLET | Freq: Once | ORAL | Status: AC
Start: 2020-01-22 — End: 2020-01-22
  Administered 2020-01-22: 1 via ORAL

## 2020-01-22 MED ORDER — PROPOFOL 500 MG/50ML IV EMUL
INTRAVENOUS | Status: AC
  Filled 2020-01-22: qty 100

## 2020-01-22 MED ORDER — OXYCODONE-ACETAMINOPHEN 5-325 MG PO TABS
ORAL_TABLET | ORAL | Status: AC
  Filled 2020-01-22: qty 1

## 2020-01-22 MED ORDER — ONDANSETRON HCL 4 MG/2ML IJ SOLN: 4.0000 mg | Freq: Once | INTRAMUSCULAR | Status: DC | PRN

## 2020-01-22 MED ORDER — LACTATED RINGERS IV SOLN: INTRAVENOUS | Status: DC

## 2020-01-22 MED ORDER — DEXMEDETOMIDINE (PRECEDEX) IN NS 20 MCG/5ML (4 MCG/ML) IV SYRINGE
PREFILLED_SYRINGE | INTRAVENOUS | Status: DC | PRN
  Administered 2020-01-22: 8 ug via INTRAVENOUS
  Administered 2020-01-22: 4 ug via INTRAVENOUS
  Administered 2020-01-22: 8 ug via INTRAVENOUS

## 2020-01-22 MED ORDER — OXYCODONE-ACETAMINOPHEN 5-325 MG PO TABS: 1.0000 | ORAL_TABLET | ORAL | Status: DC | PRN

## 2020-01-22 MED ORDER — ONDANSETRON HCL 4 MG/2ML IJ SOLN
INTRAMUSCULAR | Status: AC
  Filled 2020-01-22: qty 2

## 2020-01-22 MED ORDER — LIDOCAINE HCL (PF) 2 % IJ SOLN
INTRAMUSCULAR | Status: AC
  Filled 2020-01-22: qty 10

## 2020-01-22 MED ORDER — KETOROLAC TROMETHAMINE 30 MG/ML IJ SOLN
INTRAMUSCULAR | Status: DC | PRN
  Administered 2020-01-22: 30 mg via INTRAVENOUS

## 2020-01-22 MED ORDER — FENTANYL CITRATE (PF) 100 MCG/2ML IJ SOLN
INTRAMUSCULAR | Status: AC
  Filled 2020-01-22: qty 2

## 2020-01-22 MED ORDER — LIDOCAINE HCL (CARDIAC) PF 100 MG/5ML IV SOSY
PREFILLED_SYRINGE | INTRAVENOUS | Status: DC | PRN
  Administered 2020-01-22: 80 mg via INTRAVENOUS

## 2020-01-22 MED ORDER — ACETAMINOPHEN 500 MG PO TABS
ORAL_TABLET | ORAL | Status: AC
  Filled 2020-01-22: qty 2

## 2020-01-22 MED ORDER — ACETAMINOPHEN 500 MG PO TABS
1000.0000 mg | ORAL_TABLET | ORAL | Status: AC
  Administered 2020-01-22: 1000 mg via ORAL

## 2020-01-22 MED ORDER — DEXAMETHASONE SODIUM PHOSPHATE 10 MG/ML IJ SOLN
INTRAMUSCULAR | Status: AC
  Filled 2020-01-22: qty 1

## 2020-01-22 MED ORDER — SUGAMMADEX SODIUM 200 MG/2ML IV SOLN
INTRAVENOUS | Status: DC | PRN
  Administered 2020-01-22: 200 mg via INTRAVENOUS

## 2020-01-22 MED ORDER — FAMOTIDINE 20 MG PO TABS
ORAL_TABLET | ORAL | Status: AC
  Filled 2020-01-22: qty 1

## 2020-01-22 MED ORDER — CHLORHEXIDINE GLUCONATE 0.12 % MT SOLN
OROMUCOSAL | Status: AC
  Filled 2020-01-22: qty 15

## 2020-01-22 MED ORDER — PROPOFOL 500 MG/50ML IV EMUL
INTRAVENOUS | Status: AC
  Filled 2020-01-22: qty 50

## 2020-01-22 MED ORDER — FENTANYL CITRATE (PF) 100 MCG/2ML IJ SOLN
INTRAMUSCULAR | Status: DC | PRN
  Administered 2020-01-22 (×2): 50 ug via INTRAVENOUS

## 2020-01-22 MED ORDER — BUPIVACAINE HCL 0.5 % IJ SOLN
INTRAMUSCULAR | Status: DC | PRN
  Administered 2020-01-22: 7 mL

## 2020-01-22 MED ORDER — ONDANSETRON HCL 4 MG/2ML IJ SOLN
INTRAMUSCULAR | Status: DC | PRN
  Administered 2020-01-22: 4 mg via INTRAVENOUS

## 2020-01-22 MED ORDER — PROPOFOL 500 MG/50ML IV EMUL
INTRAVENOUS | Status: DC | PRN
  Administered 2020-01-22: 150 ug/kg/min via INTRAVENOUS

## 2020-01-22 MED ORDER — FENTANYL CITRATE (PF) 100 MCG/2ML IJ SOLN
25.0000 ug | INTRAMUSCULAR | Status: AC | PRN
Start: 2020-01-22 — End: 2020-01-22
  Administered 2020-01-22 (×8): 25 ug via INTRAVENOUS

## 2020-01-22 MED ORDER — MIDAZOLAM HCL 2 MG/2ML IJ SOLN
INTRAMUSCULAR | Status: AC
  Filled 2020-01-22: qty 2

## 2020-01-22 MED ORDER — POVIDONE-IODINE 10 % EX SWAB
2.0000 "application " | Freq: Once | CUTANEOUS | Status: AC
  Administered 2020-01-22: 2 via TOPICAL

## 2020-01-22 SURGICAL SUPPLY — 37 items
APL PRP STRL LF DISP 70% ISPRP (MISCELLANEOUS) ×1
BLADE SURG SZ11 CARB STEEL (BLADE) ×3 IMPLANT
CATH ROBINSON RED A/P 16FR (CATHETERS) ×3 IMPLANT
CHLORAPREP W/TINT 26 (MISCELLANEOUS) ×3 IMPLANT
CLOSURE WOUND 1/4X4 (GAUZE/BANDAGES/DRESSINGS) ×1
COVER WAND RF STERILE (DRAPES) IMPLANT
DRSG TEGADERM 2-3/8X2-3/4 SM (GAUZE/BANDAGES/DRESSINGS) ×6 IMPLANT
GLOVE SURG SYN 8.0 (GLOVE) ×3 IMPLANT
GOWN STRL REUS W/ TWL LRG LVL3 (GOWN DISPOSABLE) ×1 IMPLANT
GOWN STRL REUS W/ TWL XL LVL3 (GOWN DISPOSABLE) ×1 IMPLANT
GOWN STRL REUS W/TWL LRG LVL3 (GOWN DISPOSABLE) ×3
GOWN STRL REUS W/TWL XL LVL3 (GOWN DISPOSABLE) ×3
GRASPER SUT TROCAR 14GX15 (MISCELLANEOUS) IMPLANT
KIT PINK PAD W/HEAD ARE REST (MISCELLANEOUS) ×3
KIT PINK PAD W/HEAD ARM REST (MISCELLANEOUS) ×1 IMPLANT
KIT TURNOVER CYSTO (KITS) ×3 IMPLANT
LABEL OR SOLS (LABEL) ×3 IMPLANT
MANIFOLD NEPTUNE II (INSTRUMENTS) ×3 IMPLANT
NDL HPO THNWL 1X22GA REG BVL (NEEDLE) ×1 IMPLANT
NEEDLE SAFETY 22GX1 (NEEDLE) ×3
NS IRRIG 500ML POUR BTL (IV SOLUTION) ×3 IMPLANT
PACK GYN LAPAROSCOPIC (MISCELLANEOUS) ×3 IMPLANT
PAD OB MATERNITY 4.3X12.25 (PERSONAL CARE ITEMS) ×3 IMPLANT
PAD PREP 24X41 OB/GYN DISP (PERSONAL CARE ITEMS) ×3 IMPLANT
SET TUBE SMOKE EVAC HIGH FLOW (TUBING) ×3 IMPLANT
SHEARS HARMONIC ACE PLUS 36CM (ENDOMECHANICALS) IMPLANT
SLEEVE ENDOPATH XCEL 5M (ENDOMECHANICALS) ×3 IMPLANT
SPONGE GAUZE 2X2 8PLY STER LF (GAUZE/BANDAGES/DRESSINGS) ×1
SPONGE GAUZE 2X2 8PLY STRL LF (GAUZE/BANDAGES/DRESSINGS) ×2 IMPLANT
STRIP CLOSURE SKIN 1/4X4 (GAUZE/BANDAGES/DRESSINGS) ×2 IMPLANT
SUT VIC AB 0 CT1 36 (SUTURE) ×3 IMPLANT
SUT VIC AB 2-0 UR6 27 (SUTURE) ×3 IMPLANT
SUT VIC AB 4-0 SH 27 (SUTURE) ×3
SUT VIC AB 4-0 SH 27XANBCTRL (SUTURE) ×1 IMPLANT
SWABSTK COMLB BENZOIN TINCTURE (MISCELLANEOUS) ×3 IMPLANT
TROCAR ENDO BLADELESS 11MM (ENDOMECHANICALS) IMPLANT
TROCAR XCEL NON-BLD 5MMX100MML (ENDOMECHANICALS) ×3 IMPLANT

## 2020-01-22 NOTE — OR Nursing (Signed)
D/C has been pending transportation. 

## 2020-01-22 NOTE — Discharge Instructions (Addendum)
AMBULATORY SURGERY  DISCHARGE INSTRUCTIONS   1) The drugs that you were given will stay in your system until tomorrow so for the next 24 hours you should not:  A) Drive an automobile B) Make any legal decisions C) Drink any alcoholic beverage   2) You may resume regular meals tomorrow.  Today it is better to start with liquids and gradually work up to solid foods.  You may eat anything you prefer, but it is better to start with liquids, then soup and crackers, and gradually work up to solid foods.   3) Please notify your doctor immediately if you have any unusual bleeding, trouble breathing, redness and pain at the surgery site, drainage, fever, or pain not relieved by medication.    4) Additional Instructions:    PERCOCET X 1 GIVEN AT THE HOSPITAL AT 11:58 AM.   TYLENOL 1,000 MG GIVEN AT THE HOSPITAL AT 8:57 AM.        Please contact your physician with any problems or Same Day Surgery at [phone], Monday through Friday 6 am to 4 pm, or [place] at [facility] number at [phone].

## 2020-01-22 NOTE — Transfer of Care (Signed)
Immediate Anesthesia Transfer of Care Note  Patient: Melinda Mendoza  Procedure(s) Performed: LAPAROSCOPIC TUBAL LIGATION WITH FALOPE RINGS (Bilateral )  Patient Location: PACU  Anesthesia Type:General  Level of Consciousness: drowsy  Airway & Oxygen Therapy: Patient Spontanous Breathing and Patient connected to face mask oxygen  Post-op Assessment: Report given to RN and Post -op Vital signs reviewed and stable  Post vital signs: Reviewed and stable  Last Vitals:  Vitals Value Taken Time  BP 141/97 01/22/20 1108  Temp    Pulse 70 01/22/20 1112  Resp 27 01/22/20 1112  SpO2 98 % 01/22/20 1112  Vitals shown include unvalidated device data.  Last Pain:  Vitals:   01/22/20 0842  TempSrc: Oral  PainSc: 0-No pain         Complications: No complications documented.

## 2020-01-22 NOTE — Anesthesia Procedure Notes (Signed)
Procedure Name: Intubation Date/Time: 01/22/2020 10:19 AM Performed by: Lynden Oxford, CRNA Pre-anesthesia Checklist: Patient identified, Emergency Drugs available, Suction available and Patient being monitored Patient Re-evaluated:Patient Re-evaluated prior to induction Oxygen Delivery Method: Circle system utilized Preoxygenation: Pre-oxygenation with 100% oxygen Induction Type: IV induction Ventilation: Mask ventilation without difficulty Laryngoscope Size: McGraph and 3 Grade View: Grade I Tube type: Oral Tube size: 7.0 mm Number of attempts: 1 Airway Equipment and Method: Stylet,  Oral airway and Video-laryngoscopy Placement Confirmation: ETT inserted through vocal cords under direct vision,  positive ETCO2 and breath sounds checked- equal and bilateral Secured at: 19 cm Tube secured with: Tape Dental Injury: Teeth and Oropharynx as per pre-operative assessment

## 2020-01-22 NOTE — Brief Op Note (Signed)
01/22/2020  10:59 AM  PATIENT:  Melinda Mendoza  34 y.o. female  PRE-OPERATIVE DIAGNOSIS:  elective sterilization  POST-OPERATIVE DIAGNOSIS:  elective sterilization  PROCEDURE:  Procedure(s): LAPAROSCOPIC TUBAL LIGATION WITH FALOPE RINGS (Bilateral)  SURGEON:  Surgeon(s) and Role:    * Timarie Labell, Ihor Austin, MD - Primary  PHYSICIAN ASSISTANT: cst  ASSISTANTS: none   ANESTHESIA:   general  EBL:  5 mL IOF 400 cc  UO 50 cc  BLOOD ADMINISTERED:none  DRAINS: none   LOCAL MEDICATIONS USED:  MARCAINE     SPECIMEN:  No Specimen  DISPOSITION OF SPECIMEN:  N/A  COUNTS:  YES  TOURNIQUET:  * No tourniquets in log *  DICTATION: .Other Dictation: Dictation Number verbal  PLAN OF CARE: Discharge to home after PACU  PATIENT DISPOSITION:  PACU - hemodynamically stable.   Delay start of Pharmacological VTE agent (>24hrs) due to surgical blood loss or risk of bleeding: not applicable

## 2020-01-22 NOTE — Anesthesia Preprocedure Evaluation (Signed)
Anesthesia Evaluation  Patient identified by MRN, date of birth, ID band Patient awake    Reviewed: Allergy & Precautions, NPO status , Patient's Chart, lab work & pertinent test results  History of Anesthesia Complications (+) Family history of anesthesia reaction  Airway Mallampati: III  TM Distance: <3 FB     Dental  (+) Chipped   Pulmonary asthma , sleep apnea , former smoker,    Pulmonary exam normal        Cardiovascular hypertension, Normal cardiovascular exam     Neuro/Psych  Headaches, PSYCHIATRIC DISORDERS Anxiety Depression Bipolar Disorder    GI/Hepatic Neg liver ROS, GERD  ,  Endo/Other  negative endocrine ROS  Renal/GU negative Renal ROS  negative genitourinary   Musculoskeletal negative musculoskeletal ROS (+)   Abdominal Normal abdominal exam  (+)   Peds negative pediatric ROS (+)  Hematology  (+) anemia ,   Anesthesia Other Findings Past Medical History: No date: Anxiety No date: Asthma     Comment:  d/t allergies No date: Bipolar 1 disorder (HCC) No date: Chronic pain No date: Depression 2018: Family history of adverse reaction to anesthesia     Comment:  mother couldn't wake up from surgery. put into medical               coma and week later, DIED. No date: GERD (gastroesophageal reflux disease)     Comment:  uses TUMS No date: Hypertension No date: Irregular heartbeat No date: Migraine     Comment:  uses ibuprofen No date: Sleep apnea     Comment:  mild, no cpap  Reproductive/Obstetrics                             Anesthesia Physical Anesthesia Plan  ASA: II  Anesthesia Plan: General   Post-op Pain Management:    Induction: Intravenous  PONV Risk Score and Plan:   Airway Management Planned: Oral ETT and Video Laryngoscope Planned  Additional Equipment:   Intra-op Plan:   Post-operative Plan: Extubation in OR  Informed Consent: I have  reviewed the patients History and Physical, chart, labs and discussed the procedure including the risks, benefits and alternatives for the proposed anesthesia with the patient or authorized representative who has indicated his/her understanding and acceptance.     Dental advisory given  Plan Discussed with: CRNA and Surgeon  Anesthesia Plan Comments: (Slight nasal drainage, but no fever and lungs clear.)        Anesthesia Quick Evaluation

## 2020-01-22 NOTE — Progress Notes (Signed)
Pt is ready for elective BTL . Marland Kitchen Labs reviewed . All questions answered . Proceed

## 2020-01-23 NOTE — Op Note (Signed)
Melinda Mendoza, PARILLA MEDICAL RECORD [id number] ACCOUNT [id number] DATE OF BIRTH:[birth year] FACILITY: [facility] LOCATION: [facility] PHYSICIAN:Vinton Layson Cloyde Reams, MD  OPERATIVE REPORT  DATE OF PROCEDURE:  01/22/2020  PREOPERATIVE DIAGNOSIS:  Elective permanent sterilization.  POSTOPERATIVE DIAGNOSIS:  Elective permanent sterilization.  PROCEDURE:  Laparoscopic bilateral tubal ligation, Falope rings.  ANESTHESIA:  General endotracheal anesthesia.  SURGEON:  Jennell Corner, MD  INDICATIONS:  A 34 year old gravida 3, para 3.  The patient has elected for permanent sterilization.  DESCRIPTION OF PROCEDURE:  After adequate general endotracheal anesthesia, the patient was placed in dorsal supine position with legs in the [place] stirrups.  The patient's abdomen, perineum and vagina were prepped and draped in normal sterile fashion.   Timeout was performed.  A 5 mm infraumbilical incision was made and the laparoscope was advanced into the abdominal cavity utilizing the Optiview cannula.  Once entry into the peritoneal cavity, the patient's abdomen was insufflated.  Second port site  was placed 2 cm above the symphysis pubis in the midline and the Falope ring trocar was advanced under direct visualization after injection with 0.5% Marcaine.  Attention was directed to the patient's right fallopian tube, which the fimbriated end was  visualized.  The Falope ring was applied to the isthmic ampullary portion of the fallopian _tube____ with resulting 1.5 cm knuckle of fallopian tube.  Similar procedure was repeated on the patient's left side.  Again, a Falope ring was applied at the isthmic  ampullary portion of the fallopian tube and a 1.5 cm knuckle of fallopian tube resulted.  Upper abdomen appeared normal.  There were no complications.  The patient's abdomen was deflated.  Of note, the patient's bladder was catheterized prior to  commencement of the case, yielding 50 mL clear urine and the  ring forceps was removed at the end of the case.  Each incision was closed with 4-0 Vicryl suture and sterile dressing applied.  ESTIMATED BLOOD LOSS:  Minimal.  INTRAOPERATIVE FLUIDS:  400 mL.  URINE OUTPUT:  50 mL.  DISPOSITION:  The patient was taken to recovery room in good condition.  HN/NUANCE  D:01/22/2020 T:01/23/2020 JOB:[id number]

## 2020-01-27 NOTE — Anesthesia Postprocedure Evaluation (Signed)
Anesthesia Post Note  Patient: Melinda Mendoza  Procedure(s) Performed: LAPAROSCOPIC TUBAL LIGATION WITH FALOPE RINGS (Bilateral )  Patient location during evaluation: PACU Anesthesia Type: General Level of consciousness: awake and alert and oriented Pain management: pain level controlled Vital Signs Assessment: post-procedure vital signs reviewed and stable Respiratory status: spontaneous breathing Cardiovascular status: blood pressure returned to baseline Anesthetic complications: no   No complications documented.   Last Vitals:  Vitals:   01/22/20 1208 01/22/20 1234  BP: (!) 168/98 (!) 176/93  Pulse: 63 (!) 58  Resp: 16 16  Temp: (!) 36.2 C   SpO2: 98% 98%    Last Pain:  Vitals:   01/25/20 0829  TempSrc:   PainSc: 3                  Ladiamond Gallina

## 2021-03-15 IMAGING — US US MFM OB COMPLETE +14 WKS
1 series · 13 of 20 positions shown · non-contrast
Comparison: none

PATIENT INFO:

PERFORMED BY:
SERVICE(S) PROVIDED:
  US MFM OB COMP LESS THAN 14 WEEKS                     76801.4
 ----------------------------------------------------------------------
INDICATIONS:
  10 weeks gestation of pregnancy
FETAL EVALUATION:
 Num Of Fetuses:          1
 Yolk Sac:
 Fetal Heart Rate(bpm):   180
 Cardiac Activity:        Present
 Presentation:            Variable
 Placenta:                Right Lateral
 Comment:     Superior subchorionic hemorrhage measures 1x1x.8 cm.
BIOMETRY:
 CRL:      31.6   mm     G. Age:  10w 0d                   EDD:   12/14/19
GESTATIONAL AGE:
 LMP:            10w 0d       Date:  03/09/19                   EDD:  12/14/19
 Best:           10w 0d    Det. By:  LMP  (03/09/19)            EDD:  12/14/19

[Series 1: us mfm ob complete +14 wks · 0.17mm/px · 13 of 20 slices shown]
[im 1/20]
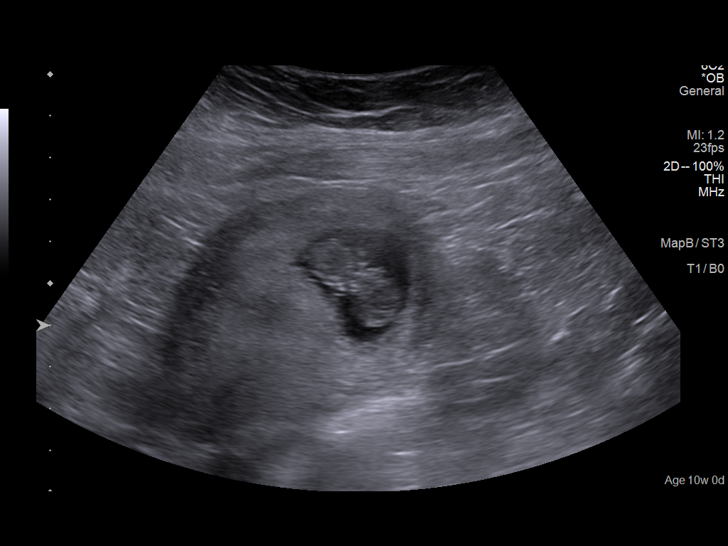
[im 3/20]
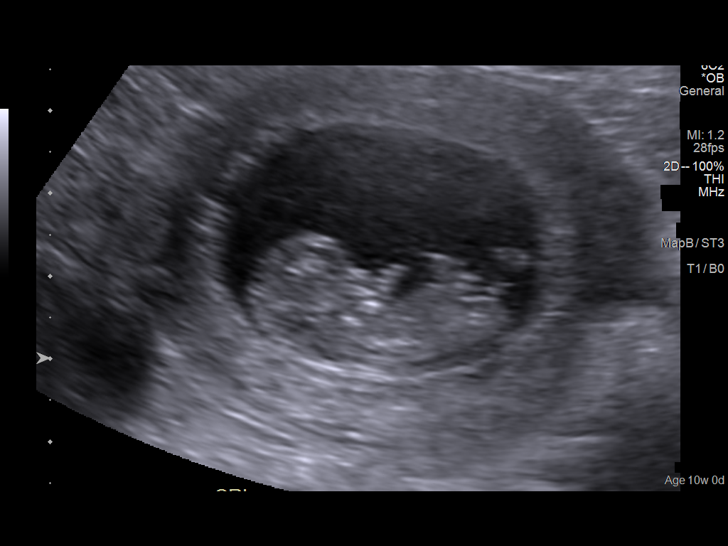
[im 4/20]
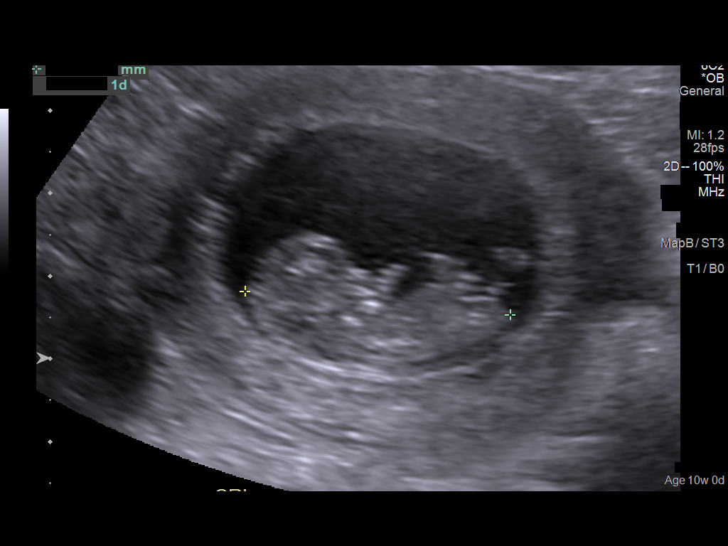
[im 6/20]
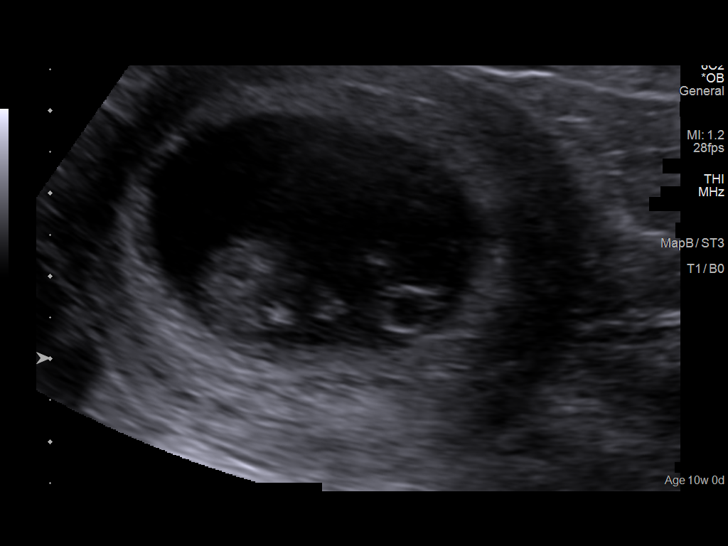
[im 7/20]
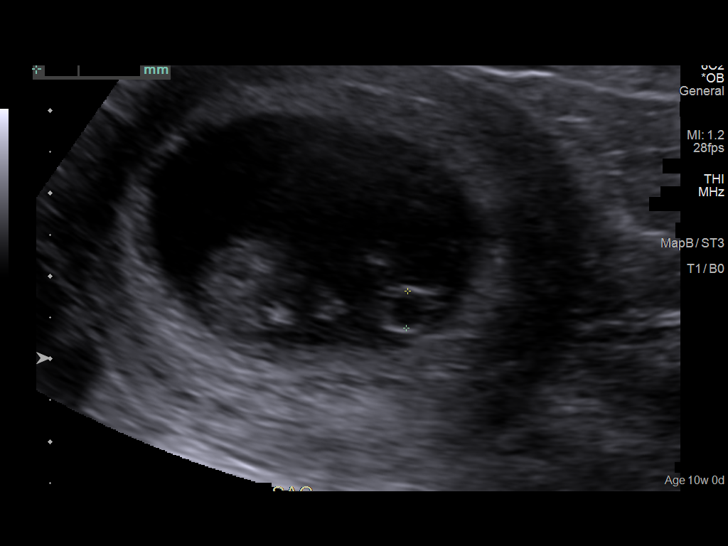
[im 9/20]
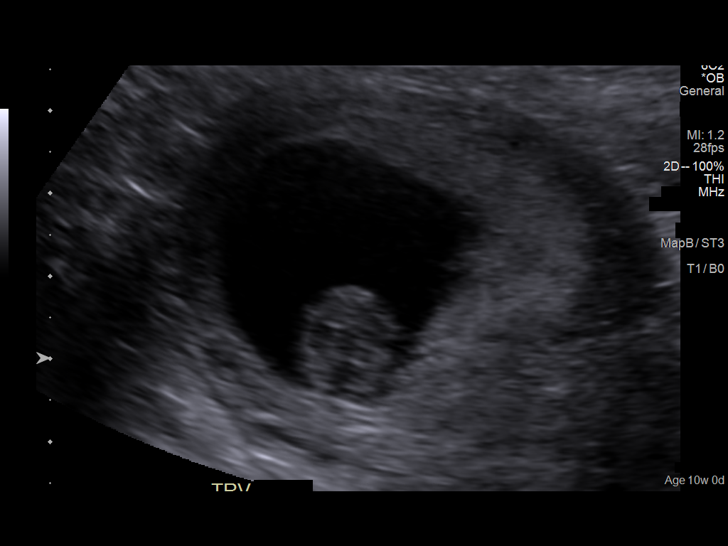
[im 11/20]
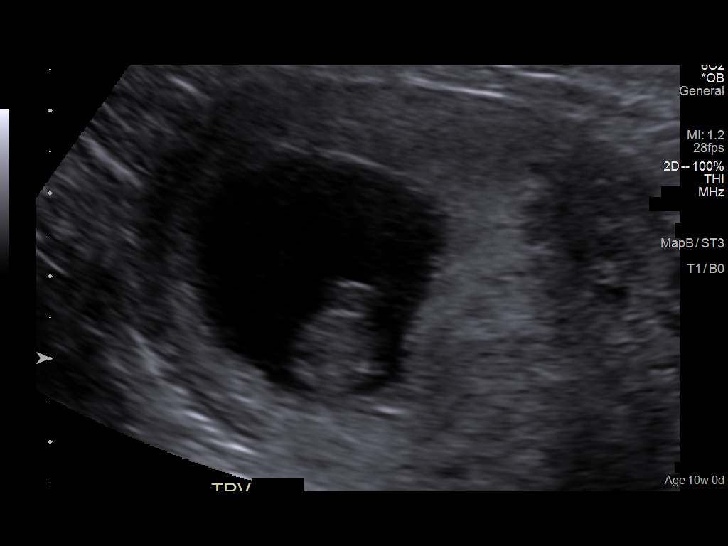
[im 12/20]
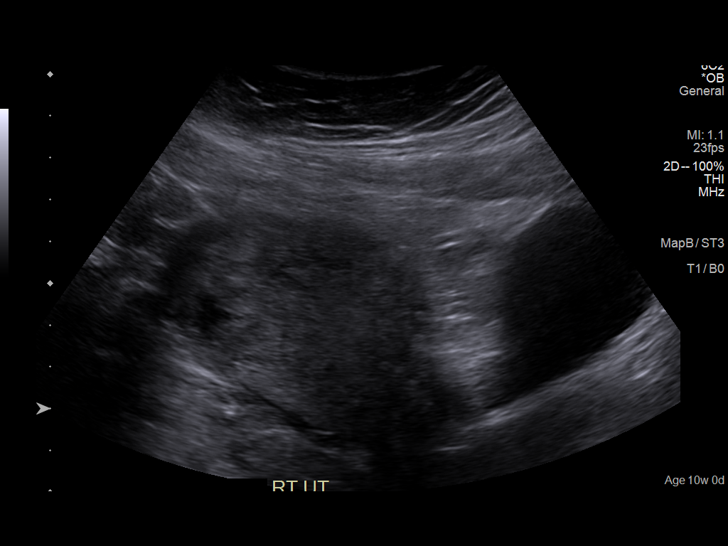
[im 14/20]
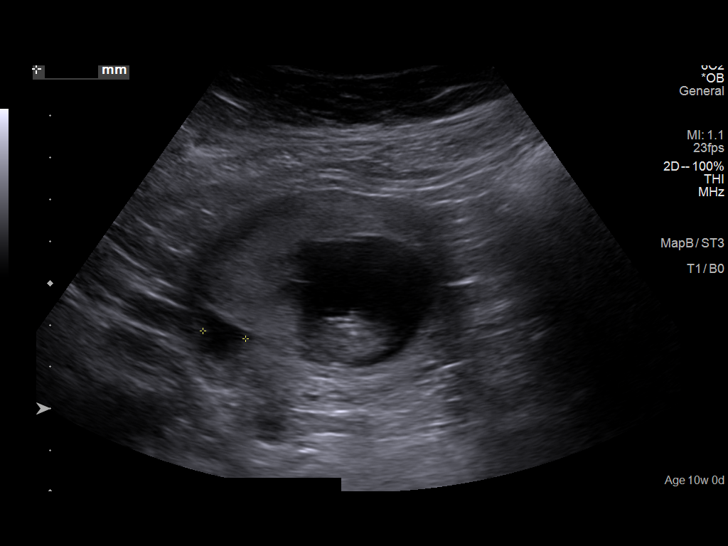
[im 15/20]
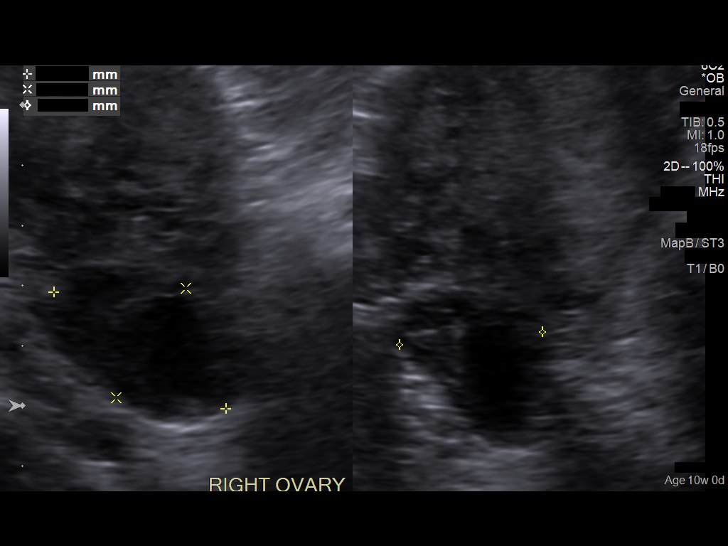
[im 17/20]
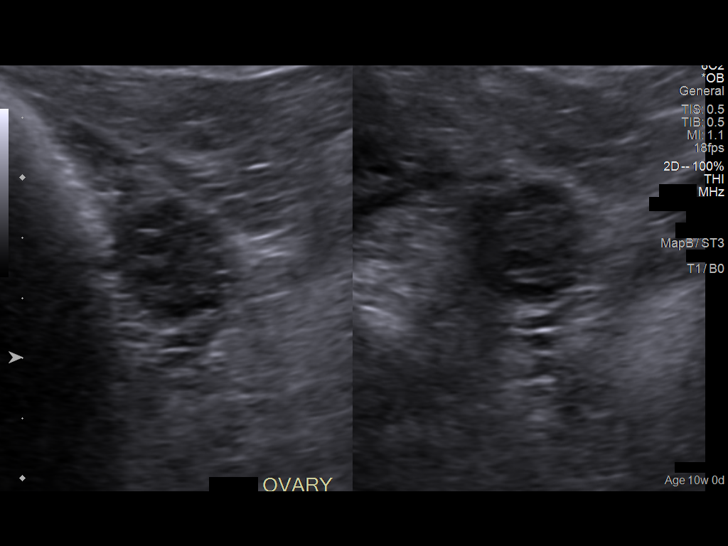
[im 18/20]
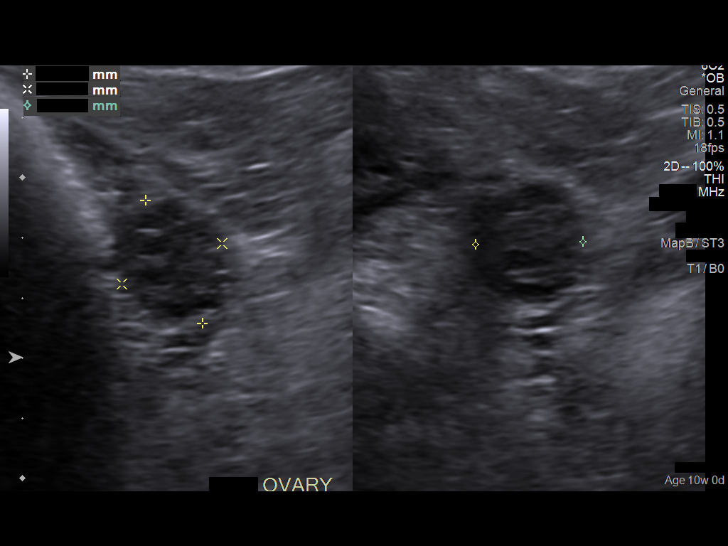
[im 20/20]
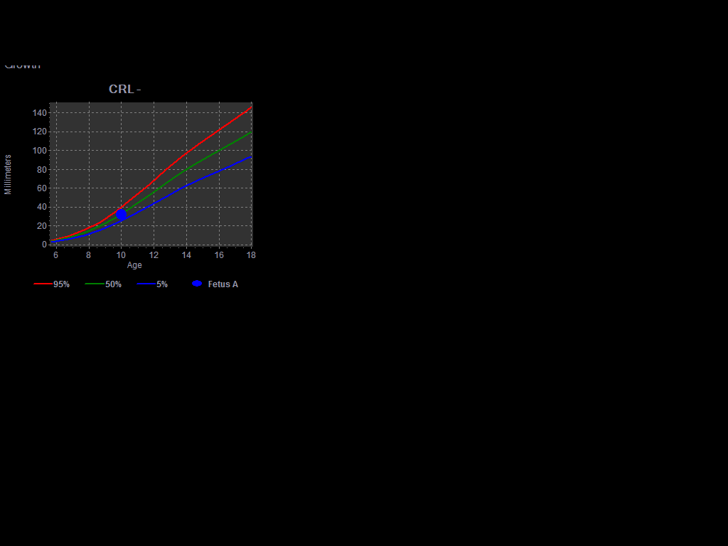

[13 of 20 positions shown; findings below may reference images not displayed]

IMPRESSION: Thank you for referring your patient for first trimester
 ultrasound.  Her history is significant for medication exposures.
 She had the opportunity to meet with our genetic counselor
 today.  Please see that note for details.
 Ultrasound demonstrates a single, live, fetus at 10 weeks.
 Dating is by LMP consistent with earliest available ultrasound
 performed at [HOSPITAL] 04/26/19; CRL measurements were 6 weeks
 1 and consistent with her LMP.
 Maternal ovaries appear normal bilaterally.
 We will collect blood for screening and notify you and the
 patient of the results.
 Thank you for allowing us to participate in your patient's care.

                  Rich, Ila

## 2021-04-03 ENCOUNTER — Other Ambulatory Visit: Payer: Self-pay

## 2021-04-03 DIAGNOSIS — Z1231 Encounter for screening mammogram for malignant neoplasm of breast: Secondary | ICD-10-CM

## 2021-05-25 ENCOUNTER — Inpatient Hospital Stay: Admission: RE | Admit: 2021-05-25 | Payer: Medicaid Other | Source: Ambulatory Visit

## 2021-06-07 ENCOUNTER — Inpatient Hospital Stay: Payer: Medicaid Other | Attending: Oncology | Admitting: Licensed Clinical Social Worker

## 2021-06-07 ENCOUNTER — Inpatient Hospital Stay: Payer: Medicaid Other

## 2021-06-07 ENCOUNTER — Encounter: Payer: Self-pay | Admitting: Licensed Clinical Social Worker

## 2021-06-07 DIAGNOSIS — Z803 Family history of malignant neoplasm of breast: Secondary | ICD-10-CM

## 2021-06-07 NOTE — Progress Notes (Signed)
REFERRING PROVIDER: ?Minda Meo, CNM ?[address] ?[place],  [place] [address] ? ?PRIMARY PROVIDER:  ?Sharyne Peach, MD ? ?PRIMARY REASON FOR VISIT:  ?1. Family history of breast cancer   ? ? ? ?HISTORY OF PRESENT ILLNESS:   ?Ms. Lanphier, a 36 y.o. female, was seen for a [facility] cancer genetics consultation at the request of Drinda Butts, CNM due to a family history of breast cancer.  Ms. Jutras presents to clinic today to discuss the possibility of a hereditary predisposition to cancer, genetic testing, and to further clarify her future cancer risks, as well as potential cancer risks for family members.  ? ?Ms. Snavely is a 36 y.o. female with no personal history of cancer.   ? ?CANCER HISTORY:  ?Oncology History  ? No history exists.  ? ? ? ?RISK FACTORS:  ?Menarche was at age 36.  ?First live birth at age 29.  ?OCP use for short time.  ?Ovaries intact: yes.  ?Hysterectomy: no.  ?Menopausal status: premenopausal.  ?HRT use: 0 years. ?Colonoscopy: no; not examined. ?Mammogram within the last year: no. ?Number of breast biopsies: 0. ?Up to date with pelvic exams: yes. ? ?Past Medical History:  ?Diagnosis Date  ? Anxiety   ? Asthma   ? d/t allergies  ? Bipolar 1 disorder ([place])   ? Chronic pain   ? Depression   ? Family history of adverse reaction to anesthesia 2018  ? mother couldn't wake up from surgery. put into medical coma and week later, DIED.  ? GERD (gastroesophageal reflux disease)   ? uses TUMS  ? Hypertension   ? Irregular heartbeat   ? Migraine   ? uses ibuprofen  ? Sleep apnea   ? mild, no cpap  ? ? ?Past Surgical History:  ?Procedure Laterality Date  ? CHOLECYSTECTOMY    ? LAPAROSCOPIC TUBAL LIGATION Bilateral 01/22/2020  ? Procedure: LAPAROSCOPIC TUBAL LIGATION WITH FALOPE RINGS;  Surgeon: Schermerhorn, Gwen Her, MD;  Location: [facility] ORS;  Service: Gynecology;  Laterality: Bilateral;  ? laproscopy for IUD removal    ? MOUTH SURGERY    ? after [facility], needed another tooth removed d/t infection   ? ? ?Social History  ? ?Socioeconomic History  ? Marital status: Single  ?  Spouse name: Darnelle Maffucci, father of baby  ? Number of children: 2  ? Years of education: Not on file  ? Highest education level: Some college, no degree  ?Occupational History  ? 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  Employer: [employer]  ?  Comment: maternity  leave  ?Tobacco Use  ? Smoking status: Former  ?  Types: Cigarettes  ?  Quit date: 10/19/2011  ?  Years since quitting: 9.6  ? Smokeless tobacco: Never  ?Vaping Use  ? Vaping Use: Never used  ?Substance and Sexual Activity  ? Alcohol use: Not Currently  ?  Comment: rarely- once per year  ? Drug use: No  ? Sexual activity: Not Currently  ?  Birth control/protection: Other-see comments  ?  Comment: BTL  ?Other Topics Concern  ? Not on file  ?Social History Narrative  ? Right handed single female. Has one daughter and one son. lives with the childs father in a one story house. There are 5 steps to enter the home. Patient occasionally drinks tea and soda, no coffee. She does not exercise, but states she is very active with her young daughter.   ? ?Social Determinants of Health  ? ?Financial Resource Strain:  Not on file  ?Food Insecurity: Not on file  ?Transportation Needs: Not on file  ?Physical Activity: Not on file  ?Stress: Not on file  ?Social Connections: Not on file  ?  ? ?FAMILY HISTORY:  ?We obtained a detailed, 4-generation family history.  Significant diagnoses are listed below: ?Family History  ?Problem Relation Age of Onset  ? Heart disease Mother   ? Diabetes Mother   ? Heart disease Maternal Grandmother   ? Diverticulitis Maternal Grandmother   ? Kidney failure Father   ? Gestational diabetes Sister   ? ?Ms. Jolin has 2 daughters, 80 and 50, and 1 son, 18 months. She has 4 maternal half sisters, 5 paternal half sisters, 2 paternal half brothers. One of her maternal half sisters had negative genetic testing.  ? ?Ms. Loiseau's mother had breast cancer at 74. She  passed away at 21 due to sepsis. Patient has 2 maternal uncles, no cancer history. Maternal grandfather died at 54. Grandmother had breast cancer in her 72s and was diagnosed 3 more times, eventually metastatic. She passed in her late 53s. She had a sister that possibly had colon cancer in childhood.  ? ?Ms. Fosnaugh's father died in his 51s and had kidney issues, possibly cancer. Patient has 2 paternal aunts, no cancers. No information about paternal grandparents. ? ?Ms. Anastos is unaware of previous family history of genetic testing for hereditary cancer risks. Patient's maternal ancestors are of [place] descent, and paternal ancestors are of [place], [place], [place], [place] descent. There is no reported Ashkenazi Jewish ancestry. There is no known consanguinity. ? ? ? ?GENETIC COUNSELING ASSESSMENT: Ms. Sakamoto is a 36 y.o. female with a family history of breast cancer which is somewhat suggestive of a hereditary cancer syndrome and predisposition to cancer. We, therefore, discussed and recommended the following at today's visit.  ? ?DISCUSSION: We discussed that approximately 10% of breast cancer is hereditary. Most cases of hereditary breast cancer are associated with BRCA1/BRCA2 genes, although there are other genes associated with hereditary cancer as well. Cancers and risks are gene specific. We discussed that testing is beneficial for several reasons including knowing about cancer risks, identifying potential screening and risk-reduction options that may be appropriate, and to understand if other family members could be at risk for cancer and allow them to undergo genetic testing.  ? ?We reviewed the characteristics, features and inheritance patterns of hereditary cancer syndromes. We also discussed genetic testing, including the appropriate family members to test, the process of testing, insurance coverage and turn-around-time for results. We discussed the implications of a negative, positive  and/or variant of uncertain significant result. We recommended Ms. Markgraf pursue genetic testing for the [facility] Multi-Cancer+RNA gene panel.  ? ?The Multi-Cancer Panel + RNA offered by [facility] includes sequencing and/or deletion duplication testing of the following 84 genes: AIP, ALK, APC, ATM, AXIN2,BAP1,  BARD1, BLM, BMPR1A, BRCA1, BRCA2, BRIP1, CASR, CDC73, CDH1, CDK4, CDKN1B, CDKN1C, CDKN2A (p14ARF), CDKN2A (p16INK4a), CEBPA, CHEK2, CTNNA1, DICER1, DIS3L2, EGFR (c.2369C>T, p.Thr790Met variant only), EPCAM (Deletion/duplication testing only), FH, FLCN, GATA2, GPC3, GREM1 (Promoter region deletion/duplication testing only), HOXB13 (c.251G>A, p.Gly84Glu), HRAS, KIT, MAX, MEN1, MET, MITF (c.952G>A, p.Glu318Lys variant only), MLH1, MSH2, MSH3, MSH6, MUTYH, NBN, NF1, NF2, NTHL1, PALB2, PDGFRA, PHOX2B, PMS2, POLD1, POLE, POT1, PRKAR1A, PTCH1, PTEN, RAD50, RAD51C, RAD51D, RB1, RECQL4, RET, RUNX1, SDHAF2, SDHA (sequence changes only), SDHB, SDHC, SDHD, SMAD4, SMARCA4, SMARCB1, SMARCE1, STK11, SUFU, TERC, TERT, TMEM127, TP53, TSC1, TSC2, VHL, WRN and WT1. ? ?Based on Ms. Parada's  family history of cancer, she meets medical criteria for genetic testing. Despite that she meets criteria, she may still have an out of pocket cost. We discussed that if her out of pocket cost for testing is over $100, the laboratory will call and confirm whether she wants to proceed with testing.  If the out of pocket cost of testing is less than $100 she will be billed by the genetic testing laboratory.  ? ?We discussed that some people do not want to undergo genetic testing due to fear of genetic discrimination.  A federal law called the Genetic Information Non-Discrimination Act (GINA) of 2008 helps protect individuals against genetic discrimination based on their genetic test results.  It impacts both health insurance and employment.  For health insurance, it protects against increased premiums, being kicked off insurance or being forced to  take a test in order to be insured.  For employment it protects against hiring, firing and promoting decisions based on genetic test results.  Health status due to a cancer diagnosis is not protected

## 2021-06-22 ENCOUNTER — Telehealth: Payer: Self-pay | Admitting: Licensed Clinical Social Worker

## 2021-06-22 ENCOUNTER — Encounter: Payer: Self-pay | Admitting: Licensed Clinical Social Worker

## 2021-06-22 ENCOUNTER — Ambulatory Visit: Payer: Self-pay | Admitting: Licensed Clinical Social Worker

## 2021-06-22 DIAGNOSIS — Z1379 Encounter for other screening for genetic and chromosomal anomalies: Secondary | ICD-10-CM | POA: Insufficient documentation

## 2021-06-22 NOTE — Progress Notes (Signed)
HPI:  [name] was previously seen in the [facility] clinic due to a family history of breast cancer and concerns regarding a hereditary predisposition to cancer. Please refer to our prior cancer genetics clinic note for more information regarding our discussion, assessment and recommendations, at the time. [name] recent genetic test results were disclosed to her, as were recommendations warranted by these results. These results and recommendations are discussed in more detail below. ? ?CANCER HISTORY:  ?Oncology History  ? No history exists.  ? ? ?FAMILY HISTORY:  ?We obtained a detailed, 4-generation family history.  Significant diagnoses are listed below: ? ?Family History  ?Problem Relation Age of Onset  ? Heart disease Mother   ? Diabetes Mother   ? Breast cancer Mother 11  ? Kidney failure Father   ? Gestational diabetes Sister   ? Heart disease Maternal Grandmother   ? Diverticulitis Maternal Grandmother   ? Breast cancer Maternal Grandmother   ?     dx 50s, dx 3 more times  ? ? ?[name] has 2 daughters, 24 and 46, and 1 son, 18 months. She has 4 maternal half sisters, 5 paternal half sisters, 2 paternal half brothers. One of her maternal half sisters had negative genetic testing.  ?  ?[name]'s mother had breast cancer at 53. She passed away at 52 due to sepsis. Patient has 2 maternal uncles, no cancer history. Maternal grandfather died at 65. Grandmother had breast cancer in her 66s and was diagnosed 3 more times, eventually metastatic. She passed in her late 31s. She had a sister that possibly had colon cancer in childhood.  ?  ?[name]'s father died in his 46s and had kidney issues, possibly cancer. Patient has 2 paternal aunts, no cancers. No information about paternal grandparents. ?  ?[name] is unaware of previous family history of genetic testing for hereditary cancer risks. Patient's maternal ancestors are of [place] descent, and paternal ancestors are of  [place], [place], [place], [place] descent. There is no reported Ashkenazi Jewish ancestry. There is no known consanguinity. ?  ?  ? 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 The remainder of testing was negative/normal. ? ?The Multi-Cancer Panel + RNA offered by [facility] includes sequencing and/or deletion duplication testing of the following 84 genes: AIP, ALK, APC, ATM, AXIN2,BAP1,  BARD1, BLM, BMPR1A, BRCA1, BRCA2, BRIP1, CASR, CDC73, CDH1, CDK4, CDKN1B, CDKN1C, CDKN2A (p14ARF), CDKN2A (p16INK4a), CEBPA, CHEK2, CTNNA1, DICER1, DIS3L2, EGFR (c.2369C>T, p.Thr790Met variant only), EPCAM (Deletion/duplication testing only), FH, FLCN, GATA2, GPC3, GREM1 (Promoter region deletion/duplication testing only), HOXB13 (c.251G>A, p.Gly84Glu), HRAS, KIT, MAX, MEN1, MET, MITF (c.952G>A, p.Glu318Lys variant only), MLH1, MSH2, MSH3, MSH6, MUTYH, NBN, NF1, NF2, NTHL1, PALB2, PDGFRA, PHOX2B, PMS2, POLD1, POLE, POT1, PRKAR1A, PTCH1, PTEN, RAD50, RAD51C, RAD51D, RB1, RECQL4, RET, RUNX1, SDHAF2, SDHA (sequence changes only), SDHB, SDHC, SDHD, SMAD4, SMARCA4, SMARCB1, SMARCE1, STK11, SUFU, TERC, TERT, TMEM127, TP53, TSC1, TSC2, VHL, WRN and WT1. ? ?The test report has been scanned into EPIC and is located under the Molecular Pathology section of the Results Review tab.  A portion of the result report is included below for reference.  ? ? ?We discussed that because current genetic testing is not perfect, it is possible there may be a gene mutation in one of these genes that current testing cannot detect, but that chance is small.  There could be another  gene that has not yet been discovered, or that we have not yet tested, that is responsible for the cancer diagnoses in the family. It is also possible there is a hereditary cause for the cancer in the family that [name] did not inherit and therefore was  not identified in her testing.  Therefore, it is important to remain in touch with cancer genetics in the future so that we can continue to offer [name] the most up to date genetic testing.  ? ?HOXB13 ? ?HOXB13 is associated with an increased prostate cancer risk. Those with a HOXB13 risk allele have a  3-4.5 times more likely to get prostate cancer than the average individual. While this does not impact medical management for [name], it Melinda Mendoza be important for relatives, especially males, to be aware of this and to have testing. Men with HOXB13 risk allele can consider prostate cancer screening starting age 65.  ? ?ADDITIONAL GENETIC TESTING: We discussed with [name] that her genetic testing was fairly extensive.  If there are genes identified to increase cancer risk that can be analyzed in the future, we would be happy to discuss and coordinate this testing at that time.   ? ?CANCER SCREENING RECOMMENDATIONS: We have not identified a hereditary cause for her [name]'s family history of cancer at this time.  ? ?While reassuring, this does not definitively rule out a hereditary predisposition to cancer. It is still possible that there could be genetic mutations that are undetectable by current technology. There could be genetic mutations in genes that have not been tested or identified to increase cancer risk.  Therefore, it is recommended she continue to follow the cancer management and screening guidelines provided by her primary healthcare provider.  ? ?An individual's cancer risk and medical management are not determined by genetic test results alone. Overall cancer risk assessment incorporates additional factors, including personal medical history, family history, and any available genetic information that may result in a personalized plan for cancer prevention and surveillance. ? ?Based on [name]'s personal and family history of cancer as well as her genetic test results, risk model Melinda Mendoza  was used to estimate her risk of developing breast cancer. This estimates her lifetime risk of developing breast cancer to be approximately 20.7%.  The patient's lifetime breast cancer risk is a preliminary estimate based on available information using one of several models endorsed by the [employer]  (NCCN). The NCCN recommends consideration of breast MRI screening as an adjunct to mammography for patients at high risk (defined as 20% or greater lifetime risk).  This risk estimate can change over time and may be repeated to reflect new information in her personal or family history in the future. ? ? [name] has been determined to be at high risk for breast cancer. Her Tyrer-Cuzick risk score is 20.7%.  For women with a greater than 20% lifetime risk of breast cancer, the [employer]  (NCCN) recommends the following:  ? 1.      Clinical encounter every 6-12 months to begin when identified as being at increased risk, but not before age 108  ?2.      Annual mammograms. Tomosynthesis is recommended starting 10 years earlier than the youngest breast cancer diagnosis in the family or at age 34 (whichever comes first), but not before age 107   ? 3.      Annual breast MRI starting 10 years earlier than the youngest breast cancer diagnosis  in the family or at age 39 (whichever comes first), but not before age 55  ? ?[name] would like to be referred to the high risk breast clinic. Referral placed today.  ? ? ? ?RECOMMENDATIONS FOR FAMILY MEMBERS:  Relatives in this family might be at some increased risk of developing cancer, over the general population risk, simply due to the family history of cancer.  We recommended female relatives in this family have a yearly mammogram beginning at age 86, or 16 years younger than the earliest onset of cancer, an annual clinical breast exam, and perform monthly breast self-exams. Female relatives in this family should also have a  gynecological exam as recommended by their primary provider.  All family members should be referred for colonoscopy starting at age 65.  ? ?It is also possible there is a hereditary cause for the cancer in Ms.

## 2021-06-22 NOTE — Telephone Encounter (Signed)
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 All other genes were negative/normal. Discussed her Melinda Mendoza score is 20.7%, patient would like referral to high risk breast clinic.  ? ? ?

## 2021-06-30 ENCOUNTER — Encounter: Payer: Self-pay | Admitting: Oncology

## 2021-06-30 NOTE — Progress Notes (Signed)
Patient contacted for MD visit chart review.

## 2021-07-03 ENCOUNTER — Inpatient Hospital Stay: Payer: Medicaid Other | Attending: Oncology | Admitting: Oncology

## 2021-07-03 ENCOUNTER — Other Ambulatory Visit: Payer: Self-pay

## 2021-07-03 ENCOUNTER — Encounter: Payer: Self-pay | Admitting: Oncology

## 2021-07-03 VITALS — BP 152/83 | HR 63 | Temp 97.4°F | Resp 18 | Wt 254.9 lb

## 2021-07-03 DIAGNOSIS — Z148 Genetic carrier of other disease: Secondary | ICD-10-CM | POA: Diagnosis present

## 2021-07-03 DIAGNOSIS — Z809 Family history of malignant neoplasm, unspecified: Secondary | ICD-10-CM

## 2021-07-03 DIAGNOSIS — Z803 Family history of malignant neoplasm of breast: Secondary | ICD-10-CM | POA: Diagnosis not present

## 2021-07-03 DIAGNOSIS — I10 Essential (primary) hypertension: Secondary | ICD-10-CM | POA: Insufficient documentation

## 2021-07-03 DIAGNOSIS — Z87891 Personal history of nicotine dependence: Secondary | ICD-10-CM | POA: Diagnosis not present

## 2021-07-03 DIAGNOSIS — Z9189 Other specified personal risk factors, not elsewhere classified: Secondary | ICD-10-CM

## 2021-07-03 DIAGNOSIS — Z1501 Genetic susceptibility to malignant neoplasm of breast: Secondary | ICD-10-CM | POA: Diagnosis present

## 2021-07-03 NOTE — Progress Notes (Signed)
Hematology/Oncology Consult note [phone] [phone]         Patient Care Team: Rayetta Humphrey, MD as PCP - General (Family Medicine)  REFERRING PROVIDER: Minerva Areola, [facility]  CHIEF COMPLAINTS/REASON FOR VISIT:  Evaluation of high risk breast cancer  HISTORY OF PRESENTING ILLNESS:   Melinda Mendoza is a  36 y.o.  female with PMH listed below was seen in consultation at the request of  Minerva Areola, [facility]  for evaluation of high risk breast cancer  Patient recently was seen by genetic counselor for family history of breast cancer. Patient's mother was diagnosed with breast cancer at age of 90, passed away in her 8s due to sepsis.  Patient recalls that her mother was on Arimidex treatments.  Maternal grandmother had breast cancer in her 36s-50s, will eventually became metastatic. Patient is single.  She has 2 daughters and 1 son.  Her menstrual period resumed after she stopped breast-feeding earlier this year and has not been regular.  Patient does monthly breast examination and has no breast concerns today.  Menarche was at age 71.  First live birth at age 71.  OCP use for brief period of time.  Ovaries intact: yes.  Hysterectomy: no.  Menopausal status: premenopausal.  HRT use: 0 years. Colonoscopy: no; not examined. Mammogram within the last year: no. Number of breast biopsies: 0.  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  She has discussed the results with genetic counselor, her first-degree family members were recommended to consider screening.  Based on her personal and family history of cancer as well as her genetic test results, risk model Rockne Menghini was used to estimate her risk of developing breast cancer.  Patient has a estimated lifetime risk of developing breast cancer to be approximately 20.7%, high risk.  Patient was referred to establish care with oncology for  further evaluation and management  + Back pain due to heavy breasts.  Review of Systems  Constitutional:  Negative for appetite change, chills, fatigue and fever.  HENT:   Negative for hearing loss and voice change.   Eyes:  Negative for eye problems.  Respiratory:  Negative for chest tightness and cough.   Cardiovascular:  Negative for chest pain.  Gastrointestinal:  Negative for abdominal distention, abdominal pain and blood in stool.  Endocrine: Negative for hot flashes.  Genitourinary:  Negative for difficulty urinating and frequency.   Musculoskeletal:  Negative for arthralgias.  Skin:  Negative for itching and rash.  Neurological:  Negative for extremity weakness.  Hematological:  Negative for adenopathy.  Psychiatric/Behavioral:  Negative for confusion.    MEDICAL HISTORY:  Past Medical History:  Diagnosis Date   Anxiety    Asthma    d/t allergies   Bipolar 1 disorder (HCC)    Chronic pain    Depression    Family history of adverse reaction to anesthesia 2018   mother couldn't wake up from surgery. put into medical coma and week later, DIED.   GERD (gastroesophageal reflux disease)    uses TUMS   Hypertension    Irregular heartbeat    Migraine    uses ibuprofen   Sleep apnea    mild, no cpap    SURGICAL HISTORY: Past Surgical History:  Procedure Laterality Date   CHOLECYSTECTOMY     LAPAROSCOPIC TUBAL LIGATION Bilateral 01/22/2020   Procedure: LAPAROSCOPIC TUBAL LIGATION WITH FALOPE RINGS;  Surgeon: Schermerhorn, Ihor Austin, MD;  Location: [facility] ORS;  Service: Gynecology;  Laterality: Bilateral;   laproscopy for IUD removal     MOUTH SURGERY     after [facility], needed another tooth removed d/t infection    SOCIAL HISTORY: Social History   Socioeconomic History   Marital status: Single    Spouse name: Feliz Beamtravis, father of baby   Number of children: 2   Years of education: Not on file   Highest education level: Some college, no degree  Occupational History    Occupation: Herbalisthabilitation tech.requires lifting    Employer: [employer]    Comment: maternity  leave  Tobacco Use   Smoking status: Former    Packs/day: 0.50    Years: 13.00    Pack years: 6.50    Types: Cigarettes    Quit date: 10/19/2011    Years since quitting: 9.7   Smokeless tobacco: Never  Vaping Use   Vaping Use: Never used  Substance and Sexual Activity   Alcohol use: Not Currently    Comment: rarely- once per year   Drug use: No   Sexual activity: Not Currently    Birth control/protection: Other-see comments    Comment: BTL  Other Topics Concern   Not on file  Social History Narrative   Right handed single female. Has one daughter and one son. lives with the childs father in a one story house. There are 5 steps to enter the home. Patient occasionally drinks tea and soda, no coffee. She does not exercise, but states she is very active with her young daughter.    Social Determinants of Health   Financial Resource Strain: Not on file  Food Insecurity: Not on file  Transportation Needs: Not on file  Physical Activity: Not on file  Stress: Not on file  Social Connections: Not on file  Intimate Partner Violence: Not on file    FAMILY HISTORY: Family History  Problem Relation Age of Onset   Heart disease Mother    Diabetes Mother    Breast cancer Mother [id number]   Kidney failure Father    Gestational diabetes Sister    Heart disease Maternal Grandmother    Diverticulitis Maternal Grandmother    Breast cancer Maternal Grandmother        dx 2850s, dx 3 more times    ALLERGIES:  is allergic to gabapentin and tioconazole.  MEDICATIONS:  Current Outpatient Medications  Medication Sig Dispense Refill   acetaminophen (TYLENOL) 500 MG tablet Take 500 mg by mouth every 6 (six) hours as needed.     albuterol (VENTOLIN HFA) 108 (90 Base) MCG/ACT inhaler Inhale 1-2 puffs into the lungs every 6 (six) hours as needed for wheezing or shortness of breath.     b complex  vitamins capsule Take 1 capsule by mouth daily.     cetirizine (ZYRTEC) 10 MG tablet Take 10 mg by mouth daily.     cyanocobalamin 1000 MCG tablet Take 1,000 mcg by mouth daily.     Digestive Enzymes (SUPER ENZYMES PO) Take by mouth.     ergocalciferol (VITAMIN D2) 1.25 MG (50000 UT) capsule Take 1 capsule by mouth once a week.     escitalopram (LEXAPRO) 10 MG tablet Take by mouth.     ibuprofen (ADVIL) 400 MG tablet Take 400 mg by mouth every 6 (six) hours as needed.     MAGNESIUM OXIDE 400 PO Take by mouth.     montelukast (SINGULAIR) 10 MG tablet Take 10 mg by mouth at bedtime.  Probiotic Product (PROBIOTIC-10 PO) Take by mouth.     tiZANidine (ZANAFLEX) 4 MG tablet Take 1 tablet by mouth every 8 (eight) hours as needed.     topiramate (TOPAMAX) 25 MG tablet Take by mouth.     NIFEdipine (PROCARDIA XL/NIFEDICAL XL) 60 MG 24 hr tablet TAKE 1 TABLET BY MOUTH DAILY 30 tablet 11   No current facility-administered medications for this visit.     PHYSICAL EXAMINATION:  Vitals:   07/03/21 0936  BP: (!) 152/83  Pulse: 63  Resp: 18  Temp: (!) 97.4 F (36.3 C)   Filed Weights   07/03/21 0936  Weight: 254 lb 14.4 oz (115.6 kg)    Physical Exam Constitutional:      General: She is not in acute distress. HENT:     Head: Normocephalic and atraumatic.  Eyes:     General: No scleral icterus. Cardiovascular:     Rate and Rhythm: Normal rate and regular rhythm.     Heart sounds: Normal heart sounds.  Pulmonary:     Effort: Pulmonary effort is normal. No respiratory distress.     Breath sounds: No wheezing.  Abdominal:     General: Bowel sounds are normal. There is no distension.     Palpations: Abdomen is soft.  Musculoskeletal:        General: No deformity. Normal range of motion.     Cervical back: Normal range of motion and neck supple.  Skin:    General: Skin is warm and dry.     Findings: No erythema or rash.  Neurological:     Mental Status: She is alert and oriented  to person, place, and time. Mental status is at baseline.     Cranial Nerves: No cranial nerve deficit.     Coordination: Coordination normal.  Psychiatric:        Mood and Affect: Mood normal.  Bilateral breast was examined. No palpable discrete mass seen bilateral breast.  No palpable bilateral axillary lymphadenopathy.  LABORATORY DATA:  I have reviewed the data as listed Lab Results  Component Value Date   WBC 7.2 01/20/2020   HGB 12.8 01/20/2020   HCT 39.3 01/20/2020   MCV 86.6 01/20/2020   PLT 219 01/20/2020   No results for input(s): NA, K, CL, CO2, GLUCOSE, BUN, CREATININE, CALCIUM, GFRNONAA, GFRAA, PROT, ALBUMIN, AST, ALT, ALKPHOS, BILITOT, BILIDIR, IBILI in the last 8760 hours. Iron/TIBC/Ferritin/ %Sat No results found for: IRON, TIBC, FERRITIN, IRONPCTSAT    RADIOGRAPHIC STUDIES: I have personally reviewed the radiological images as listed and agreed with the findings in the report. No results found.    ASSESSMENT & PLAN:  1. At high risk for breast cancer   2. Family history of cancer    #At high risk for breast cancer Recommend patient to start with annual mammogram and annual MRI.  We will alternate every 6 months. Chemoprevention with tamoxifen was discussed.  Rationale and potential side effects were reviewed in details.  Patient is undecided and she will update me if she decides to proceed with this option. Encourage monthly breast self-examination Discussed about healthy diet and exercising.  #Family history of cancer, HOXB13 Recommend first-degree family members to be screened for mutation.  .  Patient will follow-up in 6 months. Orders Placed This Encounter  Procedures   MM DIAG BREAST TOMO BILATERAL    Standing Status:   Future    Standing Expiration Date:   07/04/2022    Order Specific Question:   Reason for  Exam (SYMPTOM  OR DIAGNOSIS REQUIRED)    Answer:   high risk breast cancer    Order Specific Question:   Is the patient pregnant?     Answer:   No    Order Specific Question:   Preferred imaging location?    Answer:   [facility]    All questions were answered. The patient knows to call the clinic with any problems questions or concerns.  cc Minerva Areola, [facility]  Thank you for this kind referral and the opportunity to participate in the care of this patient. A copy of today's note is routed to referring provider   Rickard Patience, MD, PhD [facility] Hematology Oncology 07/03/2021

## 2021-07-05 ENCOUNTER — Ambulatory Visit
Admission: RE | Admit: 2021-07-05 | Discharge: 2021-07-05 | Disposition: A | Discharge status: Home / Self Care | Payer: Medicaid Other | Source: Ambulatory Visit | Attending: Oncology | Admitting: Oncology

## 2021-07-05 DIAGNOSIS — Z1231 Encounter for screening mammogram for malignant neoplasm of breast: Secondary | ICD-10-CM | POA: Insufficient documentation

## 2021-07-05 DIAGNOSIS — Z9189 Other specified personal risk factors, not elsewhere classified: Secondary | ICD-10-CM | POA: Insufficient documentation

## 2021-07-06 ENCOUNTER — Other Ambulatory Visit: Payer: Self-pay | Admitting: Oncology

## 2021-07-06 DIAGNOSIS — R928 Other abnormal and inconclusive findings on diagnostic imaging of breast: Secondary | ICD-10-CM

## 2021-07-06 DIAGNOSIS — N6489 Other specified disorders of breast: Secondary | ICD-10-CM

## 2021-07-11 ENCOUNTER — Telehealth: Payer: Self-pay

## 2021-07-11 DIAGNOSIS — Z9189 Other specified personal risk factors, not elsewhere classified: Secondary | ICD-10-CM

## 2021-07-11 NOTE — Telephone Encounter (Signed)
-----   Message from Rickard Patience, MD sent at 07/11/2021 12:43 PM EDT ----- Screening mammogram showed some findings in both breasts which need further evaluation. Please let her know that I recommend Diagnostic mammogram and possibly ultrasound of both breasts. Please arrange thanks.

## 2021-07-11 NOTE — Telephone Encounter (Signed)
Called and informed patient of results and Dr. Bethanne Ginger recommendation to have Diagnostic mammo done. Patient verbalized understanding.    Please schedule patient for:  Bilateral diagnostic mammo asap. Please notify patient of appt. Thanks.

## 2021-07-11 NOTE — Progress Notes (Signed)
Ph note created   

## 2021-07-28 ENCOUNTER — Ambulatory Visit
Admission: RE | Admit: 2021-07-28 | Discharge: 2021-07-28 | Disposition: A | Discharge status: Home / Self Care | Payer: Medicaid Other | Source: Ambulatory Visit | Attending: Oncology | Admitting: Oncology

## 2021-07-28 DIAGNOSIS — N6489 Other specified disorders of breast: Secondary | ICD-10-CM

## 2021-07-28 DIAGNOSIS — R928 Other abnormal and inconclusive findings on diagnostic imaging of breast: Secondary | ICD-10-CM

## 2021-08-04 ENCOUNTER — Other Ambulatory Visit: Payer: Self-pay

## 2021-08-07 ENCOUNTER — Other Ambulatory Visit: Payer: Self-pay

## 2021-08-07 ENCOUNTER — Telehealth: Payer: Self-pay

## 2021-08-07 DIAGNOSIS — Z9189 Other specified personal risk factors, not elsewhere classified: Secondary | ICD-10-CM

## 2021-08-07 NOTE — Telephone Encounter (Signed)
Please schedule patient for right diagnostic mammo and [place] of left breast in 6 months. Also, please schedule patient for bilateral MRI of breast in November 2023. Please notify patient of appt. Thanks

## 2021-08-13 ENCOUNTER — Ambulatory Visit
Admission: EM | Admit: 2021-08-13 | Discharge: 2021-08-13 | Disposition: A | Discharge status: Home / Self Care | Payer: Medicaid Other | Attending: Emergency Medicine | Admitting: Emergency Medicine

## 2021-08-13 ENCOUNTER — Ambulatory Visit (INDEPENDENT_AMBULATORY_CARE_PROVIDER_SITE_OTHER): Payer: Medicaid Other

## 2021-08-13 ENCOUNTER — Encounter: Payer: Self-pay | Admitting: Emergency Medicine

## 2021-08-13 DIAGNOSIS — N23 Unspecified renal colic: Secondary | ICD-10-CM | POA: Diagnosis not present

## 2021-08-13 DIAGNOSIS — M545 Low back pain, unspecified: Secondary | ICD-10-CM | POA: Diagnosis not present

## 2021-08-13 DIAGNOSIS — R109 Unspecified abdominal pain: Secondary | ICD-10-CM | POA: Diagnosis not present

## 2021-08-13 LAB — CBC WITH DIFFERENTIAL/PLATELET
Abs Immature Granulocytes: 0.01 10*3/uL (ref 0.00–0.07)
Basophils Absolute: 0.1 10*3/uL (ref 0.0–0.1)
Basophils Relative: 1 %
Eosinophils Absolute: 0.2 10*3/uL (ref 0.0–0.5)
Eosinophils Relative: 2 %
HCT: 36.9 % (ref 36.0–46.0)
Hemoglobin: 12.3 g/dL (ref 12.0–15.0)
Immature Granulocytes: 0 %
Lymphocytes Relative: 30 %
Lymphs Abs: 2 10*3/uL (ref 0.7–4.0)
MCH: 27.2 pg (ref 26.0–34.0)
MCHC: 33.3 g/dL (ref 30.0–36.0)
MCV: 81.6 fL (ref 80.0–100.0)
Monocytes Absolute: 0.3 10*3/uL (ref 0.1–1.0)
Monocytes Relative: 4 %
Neutro Abs: 4.3 10*3/uL (ref 1.7–7.7)
Neutrophils Relative %: 63 %
Platelets: 235 10*3/uL (ref 150–400)
RBC: 4.52 MIL/uL (ref 3.87–5.11)
RDW: 14.2 % (ref 11.5–15.5)
WBC: 6.8 10*3/uL (ref 4.0–10.5)
nRBC: 0 % (ref 0.0–0.2)

## 2021-08-13 LAB — URINALYSIS, ROUTINE W REFLEX MICROSCOPIC
Bilirubin Urine: NEGATIVE
Glucose, UA: NEGATIVE mg/dL
Ketones, ur: NEGATIVE mg/dL
Leukocytes,Ua: NEGATIVE
Nitrite: NEGATIVE
Protein, ur: 30 mg/dL — AB
Specific Gravity, Urine: 1.025 (ref 1.005–1.030)
pH: 5.5 (ref 5.0–8.0)

## 2021-08-13 LAB — URINALYSIS, MICROSCOPIC (REFLEX): RBC / HPF: >50 RBC/hpf (ref 0–5)

## 2021-08-13 MED ORDER — CEFTRIAXONE SODIUM 1 G IJ SOLR
1.0000 g | Freq: Once | INTRAMUSCULAR | Status: AC
  Administered 2021-08-13: 1 g via INTRAMUSCULAR

## 2021-08-13 MED ORDER — TAMSULOSIN HCL 0.4 MG PO CAPS: 0.4000 mg | ORAL_CAPSULE | Freq: Every day | ORAL | 0 refills | Status: AC

## 2021-08-13 MED ORDER — KETOROLAC TROMETHAMINE 60 MG/2ML IM SOLN
30.0000 mg | Freq: Once | INTRAMUSCULAR | Status: AC
  Administered 2021-08-13: 30 mg via INTRAMUSCULAR

## 2021-08-13 MED ORDER — ACETAMINOPHEN 500 MG PO TABS
1000.0000 mg | ORAL_TABLET | Freq: Once | ORAL | Status: AC
  Administered 2021-08-13: 1000 mg via ORAL

## 2021-08-13 MED ORDER — CEPHALEXIN 500 MG PO CAPS: 500.0000 mg | ORAL_CAPSULE | Freq: Four times a day (QID) | ORAL | 0 refills | Status: AC

## 2021-08-13 MED ORDER — HYDROCODONE-ACETAMINOPHEN 5-325 MG PO TABS: 1.0000 | ORAL_TABLET | Freq: Four times a day (QID) | ORAL | 0 refills | Status: DC | PRN

## 2021-08-13 MED ORDER — IBUPROFEN 600 MG PO TABS: 600.0000 mg | ORAL_TABLET | Freq: Four times a day (QID) | ORAL | 0 refills | Status: AC | PRN

## 2021-08-13 NOTE — Discharge Instructions (Addendum)
Drink plenty of extra fluids.  Take the Flomax as written.  Take 600 mg of ibuprofen combined with a Tylenol containing product 3-4 times a day as needed for pain.  The ibuprofen with 1000 mg of Tylenol for mild to moderate pain or ibuprofen with Norco for severe pain.  Do not take Tylenol and Norco, as they both have Tylenol in them and too much Tylenol can hurt your liver.  Do not exceed 4000 mg of Tylenol from all sources in 1 day.  Please follow-up with urology.  Discontinue the Keflex if urine culture comes back negative for urinary tract infection.

## 2021-08-13 NOTE — ED Triage Notes (Signed)
Patient ongoing pain in her lower back since last Tuesday.  Patient reports spasm in her back.  Patient denies any urinary symptoms.  Patient denies any fall or injury. Patient is currently on her menstrual period.

## 2021-08-13 NOTE — ED Provider Notes (Signed)
HPI  SUBJECTIVE:  Melinda Mendoza is a 36 y.o. female who presents with 6 days of constant left low back pain described as dull, achy, sharp, accompanied with muscle spasms.  She states that it has migrated anteriorly towards the left mid axillary line along upper flank.  She reports an episode of upper abdominal pain described as burning, stabbing, like "gas pain" last night that lasted a few minutes and has resolved.  No further episodes of abdominal pain.  No nausea, vomiting, fevers, body aches, flulike symptoms.  No urinary urgency, frequency, cloudy or odorous urine, hematuria.  No pelvic pain.  No trauma to the area, change in physical activity.  No saddle anesthesia, urinary fecal incontinence, urinary retention, bilateral radicular pain, lower extremity weakness.  She has had pain like this before, but this is different because it is on the left side and is not responding to usual measures.  She normally has muscle spasms / back pain on her right which usually responds ibuprofen and Zanaflex.  She has tried ibuprofen 800 mg every 6 to 8 hours, Zanaflex 4 mg and states that this is not touching her pain.  No alleviating factors.  Symptoms are worse with sitting, lying down.  She has a past medical history of degenerative disc disease in her spine with resulting chronic back pain/frequent muscle spasms.  She has a past medical history of gestational hypertension, UTI, remote history of pyelonephritis.  No history of diabetes, chronic kidney disease, nephrolithiasis.  Family history negative for nephrolithiasis.  LMP: Now.  Denies possibility being pregnant.  PCP: [facility].   Past Medical History:  Diagnosis Date   Anxiety    Asthma    d/t allergies   Bipolar 1 disorder (HCC)    Chronic pain    Depression    Family history of adverse reaction to anesthesia 2018   mother couldn't wake up from surgery. put into medical coma and week later, DIED.   GERD (gastroesophageal reflux disease)     uses TUMS   Hypertension    Irregular heartbeat    Migraine    uses ibuprofen   Sleep apnea    mild, no cpap    Past Surgical History:  Procedure Laterality Date   CHOLECYSTECTOMY     LAPAROSCOPIC TUBAL LIGATION Bilateral 01/22/2020   Procedure: LAPAROSCOPIC TUBAL LIGATION WITH FALOPE RINGS;  Surgeon: Schermerhorn, Ihor Austin, MD;  Location: [facility] OR[facility];  Service: Gynecology;  Laterality: Bilateral;   laproscopy for IUD removal     MOUTH SURGERY     after [facility], needed another tooth removed d/t infection    Family History  Problem Relation Age of Onset   Heart disease Mother    Diabetes Mother    Breast cancer Mother 32   Kidney failure Father    Gestational diabetes Sister    Heart disease Maternal Grandmother    Diverticulitis Maternal Grandmother    Breast cancer Maternal Grandmother        dx 10s, dx 3 more times    Social History   Tobacco Use   Smoking status: Former    Packs/day: 0.50    Years: 13.00    Total pack years: 6.50    Types: Cigarettes    Quit date: 10/19/2011    Years since quitting: 9.8   Smokeless tobacco: Never  Vaping Use   Vaping Use: Never used  Substance Use Topics   Alcohol use: Not Currently    Comment: rarely- once per year  Drug use: No    No current facility-administered medications for this encounter.  Current Outpatient Medications:    b complex vitamins capsule, Take 1 capsule by mouth daily., Disp: , Rfl:    cetirizine (ZYRTEC) 10 MG tablet, Take 10 mg by mouth daily., Disp: , Rfl:    cyanocobalamin 1000 MCG tablet, Take 1,000 mcg by mouth daily., Disp: , Rfl:    Digestive Enzymes (SUPER ENZYMES PO), Take by mouth., Disp: , Rfl:    ergocalciferol (VITAMIN D2) 1.25 MG (50000 UT) capsule, Take 1 capsule by mouth once a week., Disp: , Rfl:    escitalopram (LEXAPRO) 10 MG tablet, Take by mouth., Disp: , Rfl:    HYDROcodone-acetaminophen (NORCO/VICODIN) 5-325 MG tablet, Take 1-2 tablets by mouth every 6 (six) hours as needed for  moderate pain., Disp: 12 tablet, Rfl: 0   ibuprofen (ADVIL) 600 MG tablet, Take 1 tablet (600 mg total) by mouth every 6 (six) hours as needed., Disp: 30 tablet, Rfl: 0   MAGNESIUM OXIDE 400 PO, Take by mouth., Disp: , Rfl:    montelukast (SINGULAIR) 10 MG tablet, Take 10 mg by mouth at bedtime., Disp: , Rfl:    Probiotic Product (PROBIOTIC-10 PO), Take by mouth., Disp: , Rfl:    tamsulosin (FLOMAX) 0.4 MG CAPS capsule, Take 1 capsule (0.4 mg total) by mouth at bedtime for 7 days., Disp: 7 capsule, Rfl: 0   tiZANidine (ZANAFLEX) 4 MG tablet, Take 1 tablet by mouth every 8 (eight) hours as needed., Disp: , Rfl:    topiramate (TOPAMAX) 25 MG tablet, Take by mouth., Disp: , Rfl:    acetaminophen (TYLENOL) 500 MG tablet, Take 500 mg by mouth every 6 (six) hours as needed., Disp: , Rfl:    albuterol (VENTOLIN HFA) 108 (90 Base) MCG/ACT inhaler, Inhale 1-2 puffs into the lungs every 6 (six) hours as needed for wheezing or shortness of breath., Disp: , Rfl:    NIFEdipine (PROCARDIA XL/NIFEDICAL XL) 60 MG 24 hr tablet, TAKE 1 TABLET BY MOUTH DAILY, Disp: 30 tablet, Rfl: 11  Allergies  Allergen Reactions   Gabapentin Other (See Comments)    Weight gain   Tioconazole Other (See Comments)    burning     ROS  As noted in HPI.   Physical Exam  BP (!) 153/98 (BP Location: Left Arm)   Pulse 67   Temp 98.2 F (36.8 C) (Oral)   Resp 14   Ht 5\' 3"  (1.6 m)   Wt 115.2 kg   LMP 08/11/2021 (Exact Date)   SpO2 99%   Breastfeeding No   BMI 44.99 kg/m   Constitutional: Well developed, well nourished, appears uncomfortable Eyes:  EOMI, conjunctiva normal bilaterally HENT: Normocephalic, atraumatic,mucus membranes moist Respiratory: Normal inspiratory effort Cardiovascular: Normal rate GI: nondistended. No suprapubic tenderness.  Positive left upper flank tenderness, tenderness in the mid axillary line underneath rib skin: No rash, skin intact Musculoskeletal: Mild left CVAT no appreciable  paralumbar tenderness, muscle spasm.  Positive L-spine tenderness, patient states this is not new.  No SI joint, sacral bony tenderness. Bilateral lower extremities nontender, baseline ROM with intact DP  pulses.  Pain with active right hip flexion/extension against resistance.  Pain with passive internal/external rotation left hip. No pain with  AD/ABduction bilaterally. SLR neg bilaterally. Sensation baseline light touch bilaterally for Pt, DTR's symmetric and intact bilaterally KJ, Motor symmetric bilateral 5/5 hip flexion, quadriceps, hamstrings, EHL, foot dorsiflexion, foot plantarflexion. Neurologic: Alert & oriented x 3, no focal neuro deficits Psychiatric:  Speech and behavior appropriate   ED Course   Medications  acetaminophen (TYLENOL) tablet 1,000 mg (1,000 mg Oral Given 08/13/21 1050)  ketorolac (TORADOL) injection 30 mg (30 mg Intramuscular Given 08/13/21 1049)  cefTRIAXone (ROCEPHIN) injection 1 g (1 g Intramuscular Given 08/13/21 1137)    Orders Placed This Encounter  Procedures   Urine Culture    Standing Status:   Standing    Number of Occurrences:   1    Order Specific Question:   Indication    Answer:   Flank Pain   DG Abd 1 View    Standing Status:   Standing    Number of Occurrences:   1    Order Specific Question:   Reason for Exam (SYMPTOM  OR DIAGNOSIS REQUIRED)    Answer:   L back flank pain r/o kidney stone   Urinalysis, Routine w reflex microscopic Urine, Clean Catch    Standing Status:   Standing    Number of Occurrences:   1   Urinalysis, Microscopic (reflex)    Standing Status:   Standing    Number of Occurrences:   1   CBC with Differential    Standing Status:   Standing    Number of Occurrences:   1    Results for orders placed or performed during the hospital encounter of 08/13/21 (from the past 24 hour(s))  Urinalysis, Routine w reflex microscopic Urine, Clean Catch     Status: Abnormal   Collection Time: 08/13/21  9:36 AM  Result Value Ref Range    Color, Urine YELLOW YELLOW   APPearance HAZY (A) CLEAR   Specific Gravity, Urine 1.025 1.005 - 1.030   pH 5.5 5.0 - 8.0   Glucose, UA NEGATIVE NEGATIVE mg/dL   Hgb urine dipstick LARGE (A) NEGATIVE   Bilirubin Urine NEGATIVE NEGATIVE   Ketones, ur NEGATIVE NEGATIVE mg/dL   Protein, ur 30 (A) NEGATIVE mg/dL   Nitrite NEGATIVE NEGATIVE   Leukocytes,Ua NEGATIVE NEGATIVE  Urinalysis, Microscopic (reflex)     Status: Abnormal   Collection Time: 08/13/21  9:36 AM  Result Value Ref Range   RBC / HPF >50 0 - 5 RBC/hpf   WBC, UA 6-10 0 - 5 WBC/hpf   Bacteria, UA FEW (A) NONE SEEN   Squamous Epithelial / LPF 0-5 0 - 5   Ca Oxalate Crys, UA PRESENT   CBC with Differential     Status: None   Collection Time: 08/13/21 10:37 AM  Result Value Ref Range   WBC 6.8 4.0 - 10.5 K/uL   RBC 4.52 3.87 - 5.11 MIL/uL   Hemoglobin 12.3 12.0 - 15.0 g/dL   HCT 74.1 28.7 - 86.7 %   MCV 81.6 80.0 - 100.0 fL   MCH 27.2 26.0 - 34.0 pg   MCHC 33.3 30.0 - 36.0 g/dL   RDW 67.2 09.4 - 70.9 %   Platelets 235 150 - 400 K/uL   nRBC 0.0 0.0 - 0.2 %   Neutrophils Relative % 63 %   Neutro Abs 4.3 1.7 - 7.7 K/uL   Lymphocytes Relative 30 %   Lymphs Abs 2.0 0.7 - 4.0 K/uL   Monocytes Relative 4 %   Monocytes Absolute 0.3 0.1 - 1.0 K/uL   Eosinophils Relative 2 %   Eosinophils Absolute 0.2 0.0 - 0.5 K/uL   Basophils Relative 1 %   Basophils Absolute 0.1 0.0 - 0.1 K/uL   Immature Granulocytes 0 %   Abs Immature Granulocytes 0.01 0.00 -  0.07 K/uL   DG Abd 1 View  Result Date: 08/13/2021 CLINICAL DATA:  Left flank pain for 5 days. EXAM: ABDOMEN - 1 VIEW COMPARISON:  None Available. FINDINGS: The bowel gas pattern is normal. Pelvic phleboliths are noted, however no definite radio-opaque calculi are identified. Right upper quadrant surgical clips are consistent prior cholecystectomy. IMPRESSION: No acute findings.  No radiopaque urinary calculi identified. Electronically Signed   By: Danae Orleans M.D.   On: 08/13/2021  11:17    ED Clinical Impression  1. Renal colic on left side   2. Acute left-sided low back pain without sciatica      ED Assessment/Plan  [facility] narcotic database reviewed.  No opiate prescriptions since 2021.  Feel that it is appropriate to proceed with a prescription of opiates today.  Suspect nephrolithiasis.  CT not available at this facility today.  Will get a KUB, CBC.  Sending urine off for culture.  Tylenol/Toradol here.  No evidence of spinal cord involvement based on H&P. No historical red flags as noted in HPI. No physical red flags such as fever, new or different bony tenderness, lower extremity weakness, saddle anesthesia.  L-spine imaging not indicated at this time.   Reviewed labs, imaging independently.  No radiopaque urinary calculi seen.  See radiology report for full details.  Labs reviewed.  No leukocytosis, patient is afebrile, has not taken any antipyretic in the past 6 hours.  Vitals are normal.  She appears nontoxic.  Patient with large hematuria, proteinuria.  Few bacteria, calcium oxalate crystals.  No nitrites, esterase, and this is a clean-catch.   Will send this off for culture to confirm absence of UTI. CBC is normal, there is no evidence of sepsis.  While there are no radioopaque stones on x-ray, I am concerned that she has a cyst down.  I am concerned for possible infection given the few bacteria in her urine, so we will treat this until urine culture is resulted.  She has MyChart.  She states that she will discontinue the Keflex if her urine culture is negative.  Giving 1 g of Rocephin here.  Pt with some reduction in pain after pain meds.  Pt ambulatory in the UC.  We will treat as a nephrolithiasis, possibly infected, with Rocephin, Keflex 4 times daily for 10 days.  Also Flomax, ibuprofen/Tylenol containing product, Norco 3-4 times a day.  Follow-up with urology as needed, strict ER return precautions given.  Discussed labs, imaging, MDM,  treatment plan, and plan for follow-up with patient. Discussed sn/sx that should prompt return to the ED. patient agrees with plan.   Meds ordered this encounter  Medications   acetaminophen (TYLENOL) tablet 1,000 mg   ketorolac (TORADOL) injection 30 mg   cefTRIAXone (ROCEPHIN) injection 1 g   tamsulosin (FLOMAX) 0.4 MG CAPS capsule    Sig: Take 1 capsule (0.4 mg total) by mouth at bedtime for 7 days.    Dispense:  7 capsule    Refill:  0   ibuprofen (ADVIL) 600 MG tablet    Sig: Take 1 tablet (600 mg total) by mouth every 6 (six) hours as needed.    Dispense:  30 tablet    Refill:  0   HYDROcodone-acetaminophen (NORCO/VICODIN) 5-325 MG tablet    Sig: Take 1-2 tablets by mouth every 6 (six) hours as needed for moderate pain.    Dispense:  12 tablet    Refill:  0    *This clinic note was created using Dragon  dictation software. Therefore, there may be occasional mistakes despite careful proofreading.  ?     Domenick Gong, MD 08/13/21 1146

## 2021-08-14 ENCOUNTER — Emergency Department: Payer: Medicaid Other

## 2021-08-14 ENCOUNTER — Other Ambulatory Visit: Payer: Self-pay

## 2021-08-14 ENCOUNTER — Emergency Department
Admission: EM | Admit: 2021-08-14 | Discharge: 2021-08-14 | Disposition: A | Discharge status: Home / Self Care | Payer: Medicaid Other | Attending: Emergency Medicine | Admitting: Emergency Medicine

## 2021-08-14 DIAGNOSIS — M5442 Lumbago with sciatica, left side: Secondary | ICD-10-CM | POA: Diagnosis not present

## 2021-08-14 DIAGNOSIS — I10 Essential (primary) hypertension: Secondary | ICD-10-CM | POA: Diagnosis not present

## 2021-08-14 DIAGNOSIS — R109 Unspecified abdominal pain: Secondary | ICD-10-CM | POA: Insufficient documentation

## 2021-08-14 DIAGNOSIS — R1032 Left lower quadrant pain: Secondary | ICD-10-CM

## 2021-08-14 LAB — CBC
HCT: 38.5 % (ref 36.0–46.0)
Hemoglobin: 12.4 g/dL (ref 12.0–15.0)
MCH: 26.4 pg (ref 26.0–34.0)
MCHC: 32.2 g/dL (ref 30.0–36.0)
MCV: 82.1 fL (ref 80.0–100.0)
Platelets: 228 10*3/uL (ref 150–400)
RBC: 4.69 MIL/uL (ref 3.87–5.11)
RDW: 13.8 % (ref 11.5–15.5)
WBC: 6.1 10*3/uL (ref 4.0–10.5)
nRBC: 0 % (ref 0.0–0.2)

## 2021-08-14 LAB — BASIC METABOLIC PANEL
Anion gap: 10 (ref 5–15)
BUN: 10 mg/dL (ref 6–20)
CO2: 22 mmol/L (ref 22–32)
Calcium: 8.8 mg/dL — ABNORMAL LOW (ref 8.9–10.3)
Chloride: 107 mmol/L (ref 98–111)
Creatinine, Ser: 0.7 mg/dL (ref 0.44–1.00)
GFR, Estimated: >60 mL/min (ref >60)
Glucose, Bld: 105 mg/dL — ABNORMAL HIGH (ref 70–99)
Potassium: 3.5 mmol/L (ref 3.5–5.1)
Sodium: 139 mmol/L (ref 135–145)

## 2021-08-14 LAB — URINALYSIS, ROUTINE W REFLEX MICROSCOPIC
Bacteria, UA: NONE SEEN
Bilirubin Urine: NEGATIVE
Glucose, UA: NEGATIVE mg/dL
Ketones, ur: NEGATIVE mg/dL
Nitrite: NEGATIVE
Protein, ur: 100 mg/dL — AB
RBC / HPF: >50 RBC/hpf — ABNORMAL HIGH (ref 0–5)
Specific Gravity, Urine: 1.024 (ref 1.005–1.030)
pH: 6 (ref 5.0–8.0)

## 2021-08-14 LAB — POC URINE PREG, ED: Preg Test, Ur: NEGATIVE — NL

## 2021-08-14 MED ORDER — ONDANSETRON HCL 4 MG PO TABS: 4.0000 mg | ORAL_TABLET | Freq: Every day | ORAL | 0 refills | Status: DC | PRN

## 2021-08-14 MED ORDER — SODIUM CHLORIDE 0.9 % IV BOLUS
1000.0000 mL | Freq: Once | INTRAVENOUS | Status: AC
  Administered 2021-08-14: 1000 mL via INTRAVENOUS

## 2021-08-14 MED ORDER — MORPHINE SULFATE (PF) 4 MG/ML IV SOLN
4.0000 mg | Freq: Once | INTRAVENOUS | Status: AC
  Administered 2021-08-14: 4 mg via INTRAVENOUS
  Filled 2021-08-14: qty 1

## 2021-08-14 MED ORDER — KETOROLAC TROMETHAMINE 15 MG/ML IJ SOLN
15.0000 mg | Freq: Once | INTRAMUSCULAR | Status: AC
  Administered 2021-08-14: 15 mg via INTRAVENOUS
  Filled 2021-08-14: qty 1

## 2021-08-14 MED ORDER — IOHEXOL 350 MG/ML SOLN
100.0000 mL | Freq: Once | INTRAVENOUS | Status: AC | PRN
  Administered 2021-08-14: 100 mL via INTRAVENOUS

## 2021-08-14 MED ORDER — DEXAMETHASONE SODIUM PHOSPHATE 10 MG/ML IJ SOLN
10.0000 mg | Freq: Once | INTRAMUSCULAR | Status: AC
  Administered 2021-08-14: 10 mg via INTRAVENOUS
  Filled 2021-08-14: qty 1

## 2021-08-14 MED ORDER — ONDANSETRON HCL 4 MG/2ML IJ SOLN
4.0000 mg | Freq: Once | INTRAMUSCULAR | Status: AC
  Administered 2021-08-14: 4 mg via INTRAVENOUS
  Filled 2021-08-14: qty 2

## 2021-08-14 MED ORDER — LIDOCAINE 5 % EX PTCH
1.0000 | MEDICATED_PATCH | CUTANEOUS | Status: DC
  Administered 2021-08-14: 1 via TRANSDERMAL
  Filled 2021-08-14: qty 1

## 2021-08-14 NOTE — ED Provider Notes (Signed)
[facility] Provider Note    Event Date/Time   First MD Initiated Contact with Patient 08/14/21 1109     (approximate)   History   Flank Pain   HPI  Melinda Mendoza is a 36 y.o. female with a past medical history of hypertension, adjustment disorder, anxiety, depression, morbid obesity, bipolar disorder who presents today for evaluation of left-sided flank pain.  Patient reports that this began approximately 5 days ago.  She reports that she thought that she had pulled a muscle because the pain feels like muscle spasms.  She reports that she went to an urgent care yesterday and was started on antibiotics.  She reports that the pain is since moved to her left lower quadrant.  She denies fevers or chills.  She reports that she had 1 episode of vomiting right after taking Percocet which she attributes to the Percocet.  She has not had any burning with urination.  She reports that her urine was clear until today, and today began to be bloody but she also notes that just started her menstrual cycle.  She denies history of nephrolithiasis.  She reports that she has a history of left-sided back pain which is what her pain currently feels like.  Patient Active Problem List   Diagnosis Date Noted   Genetic testing 06/22/2021   Iron deficiency anemia 12/03/2019   Chronic hypertension 11/25/2019   NSVD (normal spontaneous vaginal delivery) 11/25/2019   Medication exposure during first trimester of pregnancy    Supervision of other high risk pregnancies, third trimester 05/13/2019   High-tone pelvic floor dysfunction 08/27/2017   Urinary incontinence, mixed 08/27/2017   Adjustment disorder with anxiety 11/15/2016   Reactive depression 11/15/2016   Anterior dislocation of right shoulder 11/02/2015   Bipolar affective disorder, currently depressed, moderate (HCC) 06/07/2015   Morbid obesity due to excess calories (HCC) 03/04/2015   Bipolar 2 disorder (HCC) 08/20/2013           Physical Exam   Triage Vital Signs: ED Triage Vitals  Enc Vitals Group     BP 08/14/21 0926 (!) 149/109     Pulse Rate 08/14/21 0926 73     Resp 08/14/21 0926 20     Temp 08/14/21 0926 97.9 F (36.6 C)     Temp Source 08/14/21 0926 Oral     SpO2 08/14/21 0926 97 %     Weight 08/14/21 1019 253 lb 8.5 oz (115 kg)     Height 08/14/21 1019 5\' 3"  (1.6 m)     Head Circumference --      Peak Flow --      Pain Score 08/14/21 0911 10     Pain Loc --      Pain Edu? --      Excl. in GC? --     Most recent vital signs: Vitals:   08/14/21 0926 08/14/21 1230  BP: (!) 149/109 (!) 140/99  Pulse: 73 70  Resp: 20 20  Temp: 97.9 F (36.6 C)   SpO2: 97% 98%    Physical Exam Vitals and nursing note reviewed.  Constitutional:      General: Awake and alert. No acute distress.    Appearance: Normal appearance. The patient is obese.  HENT:     Head: Normocephalic and atraumatic.     Mouth: Mucous membranes are moist.  Eyes:     General: PERRL. Normal EOMs        Right eye: No discharge.  Left eye: No discharge.     Conjunctiva/sclera: Conjunctivae normal.  Cardiovascular:     Rate and Rhythm: Normal rate and regular rhythm.     Pulses: Normal pulses.     Heart sounds: Normal heart sounds Pulmonary:     Effort: Pulmonary effort is normal. No respiratory distress.     Breath sounds: Normal breath sounds.  Abdominal:     Abdomen is soft. There is mild LLQ abdominal tenderness. No rebound or guarding. No distention.  Musculoskeletal:        General: No swelling. Normal range of motion.     Cervical back: Normal range of motion and neck supple.  Back: No midline tenderness. Left paraspinal muscle tenderness. Strength and sensation 5/5 to bilateral lower extremities. Normal great toe extension against resistance. Normal sensation throughout feet. Normal patellar reflexes. Positive SLR and opposite SLR bilaterally.  Skin:    General: Skin is warm and dry.     Capillary  Refill: Capillary refill takes less than 2 seconds.     Findings: No rash.  Neurological:     Mental Status: The patient is awake and alert.      ED Results / Procedures / Treatments   Labs (all labs ordered are listed, but only abnormal results are displayed) Labs Reviewed  URINALYSIS, ROUTINE W REFLEX MICROSCOPIC - Abnormal; Notable for the following components:      Result Value   Color, Urine RED (*)    APPearance CLOUDY (*)    Hgb urine dipstick LARGE (*)    Protein, ur 100 (*)    Leukocytes,Ua TRACE (*)    RBC / HPF >50 (*)    All other components within normal limits  BASIC METABOLIC PANEL - Abnormal; Notable for the following components:   Glucose, Bld 105 (*)    Calcium 8.8 (*)    All other components within normal limits  CBC  POC URINE PREG, ED     EKG     RADIOLOGY I independently reviewed and interpreted imaging and agree with radiologists findings.     PROCEDURES:  Critical Care performed:   Procedures   MEDICATIONS ORDERED IN ED: Medications  lidocaine (LIDODERM) 5 % 1 patch (1 patch Transdermal Patch Applied 08/14/21 1305)  ketorolac (TORADOL) 15 MG/ML injection 15 mg (15 mg Intravenous Given 08/14/21 1138)  sodium chloride 0.9 % bolus 1,000 mL (0 mLs Intravenous Stopped 08/14/21 1305)  ondansetron (ZOFRAN) injection 4 mg (4 mg Intravenous Given 08/14/21 1138)  iohexol (OMNIPAQUE) 350 MG/ML injection 100 mL (100 mLs Intravenous Contrast Given 08/14/21 1149)  dexamethasone (DECADRON) injection 10 mg (10 mg Intravenous Given 08/14/21 1419)  morphine (PF) 4 MG/ML injection 4 mg (4 mg Intravenous Given 08/14/21 1458)     IMPRESSION / MDM / ASSESSMENT AND PLAN / ED COURSE  I reviewed the triage vital signs and the nursing notes.   Differential diagnosis includes, but is not limited to, nephrolithiasis, pyelonephritis, diverticulitis, appendicitis, ovarian torsion, ovarian cyst  I reviewed the patient's chart.  Patient was seen yesterday at urgent care  where she had a KUB which was negative for ureteral stone.  Urine was sent for culture which has not yet returned.  She had few bacteria in her urine, therefore was given a gram of Rocephin and discharged on Keflex.  Despite no radiopaque stones on x-ray, the clinician who evaluated her yesterday still had a high index of suspicion for kidney stone and referred her to urology.  Patient presents emergency department today hemodynamically  stable and afebrile, though uncomfortable in appearance.  Blood work is overall reassuring, urinalysis does demonstrate RBCs, however patient is currently menstruating.  No bacteria noted.  Still awaiting urine culture obtained yesterday.  CT abdomen pelvis does not reveal nephrolithiasis or other intra-abdominal pathology.  Given that she is tender in her left lower quadrant and history of ovarian cysts, pelvic ultrasound obtained for evaluation of torsion.  Ultrasound is negative.  While awaiting the ultrasound, patient call me back to the room to tell me that she has a history of back pain and disc herniation and she feels that her symptoms are consistent with her lumbar radiculopathy.  She is requesting an x-ray of her back which is overall reassuring.  She does have a positive straight leg raise and opposite straight leg raise which is consistent with lumbar radiculopathy.  X-ray lumbar spine is normal.  No major trauma to suggest vertebral fracture.   Patient was reassessed multiple times at her emergency department stay.  Pain improved, however she is requesting a dose of her narcotic medication prior to discharge.  She has Norco at home, and is also requesting a prescription for Zofran as that she may take her Norco without nausea.  This was also provided, however she was advised that she cannot drive, operate heavy machinery, or perform any test that require concentration while taking the Norco.  She was also advised that she should not take zofran more often than  prescribed.   She has 5 out of 5 strength with intact sensation to extensor hallucis dorsiflexion and plantarflexion of bilateral lower extremities with normal patellar reflexes bilaterally. No red flags to indicate patient is at risk for more auspicious process that would require urgent/emergent spinal imaging or subspecialty evaluation at this time. No major trauma, no midline tenderness, no history or physical exam findings to suggest cauda equina syndrome or spinal cord compression. No focal neurological deficits on exam. No constitutional symptoms or history of immunosuppression or IVDA to suggest potential for epidural abscess. Not anticoagulated, no history of bleeding diastasis to suggest risk for epidural hematoma. No chronic steroid use or advanced age or history of malignancy to suggest proclivity towards pathological fracture.  CT without evidence of stone or pyelo, urine negative for nitrities/bacteria, though does reveal RBCs in the setting of menses. Patient reports not wearing tampon during UA. No chest pain, back pain, shortness of breath, neurological deficits, to suggest vascular catastrophe, and pulses are equal in all 4 extremities.    Discussed care instructions and return precautions with patient. Recommended close outpatient follow-up for re-evaluation. Patient agrees with plan of care. Will treat the patient symptomatically as needed for pain control. Will discharge patient to take these medications and return for any worsening or different pain or development of any neurologic symptoms. Educated patient regarding expected time course for back pain to improve and recommended very close outpatient follow-up. Ambulatory with a steady gait and discharged with significant other.   Patient's presentation is most consistent with acute presentation with potential threat to life or bodily function.   Clinical Course as of 08/14/21 1634  Mon Aug 14, 2021  1303 RBC / HPF(!): >50 Patient  is menstruating [JP]  1450 Patient requesting one more dose of pain medication before discharge home, feels ready to go home afterwards [JP]    Clinical Course User Index [JP] Amyra Vantuyl, Herb Grays, PA-C     FINAL CLINICAL IMPRESSION(S) / ED DIAGNOSES   Final diagnoses:  Left flank pain  Acute  left-sided low back pain with left-sided sciatica     Rx / DC Orders   ED Discharge Orders          Ordered    ondansetron (ZOFRAN) 4 MG tablet  Daily PRN        08/14/21 1449             Note:  This document was prepared using Dragon voice recognition software and may include unintentional dictation errors.   Keturah Shavers 08/14/21 1634    Sharman Cheek, MD 08/20/21 0003

## 2021-08-14 NOTE — Discharge Instructions (Signed)
Your ultrasound, CT scan, and x-ray, and bloodwork do not reveal any acute abnormalities. Please return for new, worsening, or change in symptoms or other concerns.

## 2021-08-14 NOTE — ED Triage Notes (Signed)
Pt comes with c/o left flank pain since last week. Pt states dx with kidney stone yesterday. Pt  states pain is worse and now traveled around to front. Pt states no relief with perocet. Pt states some nausea

## 2021-08-14 NOTE — ED Notes (Signed)
See triage note  States she was dx'd with renal stones yesterday  States pain is on left flank area.and is moving into lower abd  States she was given meds yesterday but pain is worse today  Positive nasuea

## 2021-08-15 LAB — URINE CULTURE: Culture: <10000 — AB

## 2022-01-03 ENCOUNTER — Inpatient Hospital Stay: Payer: Medicaid Other | Admitting: Oncology

## 2022-01-17 ENCOUNTER — Other Ambulatory Visit: Payer: Self-pay | Admitting: Obstetrics and Gynecology

## 2022-02-07 ENCOUNTER — Ambulatory Visit
Admission: RE | Admit: 2022-02-07 | Discharge: 2022-02-07 | Disposition: A | Discharge status: Home / Self Care | Payer: Medicaid Other | Source: Ambulatory Visit | Attending: Oncology | Admitting: Oncology

## 2022-02-07 DIAGNOSIS — Z9189 Other specified personal risk factors, not elsewhere classified: Secondary | ICD-10-CM

## 2022-02-14 ENCOUNTER — Inpatient Hospital Stay: Payer: Medicaid Other | Attending: Oncology | Admitting: Oncology

## 2022-02-14 ENCOUNTER — Encounter: Payer: Self-pay | Admitting: Oncology

## 2022-02-14 DIAGNOSIS — Z9189 Other specified personal risk factors, not elsewhere classified: Secondary | ICD-10-CM | POA: Insufficient documentation

## 2022-02-14 DIAGNOSIS — I10 Essential (primary) hypertension: Secondary | ICD-10-CM | POA: Insufficient documentation

## 2022-02-14 DIAGNOSIS — Z803 Family history of malignant neoplasm of breast: Secondary | ICD-10-CM | POA: Diagnosis not present

## 2022-02-14 DIAGNOSIS — N920 Excessive and frequent menstruation with regular cycle: Secondary | ICD-10-CM | POA: Diagnosis not present

## 2022-02-14 DIAGNOSIS — Z1501 Genetic susceptibility to malignant neoplasm of breast: Secondary | ICD-10-CM | POA: Diagnosis present

## 2022-02-14 DIAGNOSIS — R922 Inconclusive mammogram: Secondary | ICD-10-CM | POA: Diagnosis present

## 2022-02-14 DIAGNOSIS — Z809 Family history of malignant neoplasm, unspecified: Secondary | ICD-10-CM | POA: Insufficient documentation

## 2022-02-14 DIAGNOSIS — Z87891 Personal history of nicotine dependence: Secondary | ICD-10-CM | POA: Insufficient documentation

## 2022-02-14 NOTE — Assessment & Plan Note (Addendum)
#  At high risk for breast cancer Recommend patient to start with annual mammogram and annual MRI.  We will alternate every 6 months. Chemoprevention with tamoxifen was discussed.  Rationale and potential side effects were reviewed in details.  Patient is undecided and she will update me if she decides to proceed with this option. Encourage monthly breast self-examination Discussed about healthy diet and exercising. Mammogram results were reviewed and discussed with patient.  Obtain mammogram in 6 months. Also will obtain MRI bilateral with and without contrast due to her high Tyrer-Cuzick risk.

## 2022-02-14 NOTE — Progress Notes (Signed)
HEMATOLOGY-ONCOLOGY TeleHEALTH VISIT PROGRESS NOTE  I connected with Melinda Mendoza on 02/14/22  at 11:30 AM EST by video enabled telemedicine visit and verified that I am speaking with the correct person using two identifiers. I discussed the limitations, risks, security and privacy concerns of performing an evaluation and management service by telemedicine and the availability of in-person appointments. The patient expressed understanding and agreed to proceed.   Other persons participating in the visit and their role in the encounter:  None  Patient's location: Home  Provider's location: office Chief Complaint: At risk of developing breast cancer.   INTERVAL HISTORY Melinda Mendoza is a 37 y.o. female who has above history reviewed by me today presents for follow up visit for management of At risk of developing breast cancer. Problems and complaints are listed below: Patient has no new complaints.  She has had mammogram done recently.  She has heavy menstrual period and plan to have hysterectomy in the near future.  Review of Systems  Constitutional:  Negative for appetite change, chills, fatigue and fever.  HENT:   Negative for hearing loss and voice change.   Eyes:  Negative for eye problems.  Respiratory:  Negative for chest tightness and cough.   Cardiovascular:  Negative for chest pain.  Gastrointestinal:  Negative for abdominal distention, abdominal pain and blood in stool.  Endocrine: Negative for hot flashes.  Genitourinary:  Positive for menstrual problem. Negative for difficulty urinating and frequency.   Musculoskeletal:  Negative for arthralgias.  Skin:  Negative for itching and rash.  Neurological:  Negative for extremity weakness.  Hematological:  Negative for adenopathy.  Psychiatric/Behavioral:  Negative for confusion.     Past Medical History:  Diagnosis Date   Anxiety    Asthma    d/t allergies   Bipolar 1 disorder (HCC)    Chronic pain    Depression    Family  history of adverse reaction to anesthesia 2018   mother couldn't wake up from surgery. put into medical coma and week later, DIED.   GERD (gastroesophageal reflux disease)    uses TUMS   Hypertension    Irregular heartbeat    Migraine    uses ibuprofen   Sleep apnea    mild, no cpap   Past Surgical History:  Procedure Laterality Date   CHOLECYSTECTOMY     LAPAROSCOPIC TUBAL LIGATION Bilateral 01/22/2020   Procedure: LAPAROSCOPIC TUBAL LIGATION WITH FALOPE RINGS;  Surgeon: Schermerhorn, Gwen Her, MD;  Location: [facility] OR[facility];  Service: Gynecology;  Laterality: Bilateral;   laproscopy for IUD removal     MOUTH SURGERY     after [facility], needed another tooth removed d/t infection    Family History  Problem Relation Age of Onset   Heart disease Mother    Diabetes Mother    Breast cancer Mother 40   Kidney failure Father    Gestational diabetes Sister    Heart disease Maternal Grandmother    Diverticulitis Maternal Grandmother    Breast cancer Maternal Grandmother        dx 2s, dx 3 more times    Social History   Socioeconomic History   Marital status: Single    Spouse name: Darnelle Maffucci, father of baby   Number of children: 2   Years of education: Not on file   Highest education level: Some college, no degree  Occupational History   Occupation: Presenter, 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  Has one daughter and one son. lives with the childs father in a one story house. There are 5 steps to enter the home. Patient occasionally drinks tea and soda, no coffee. She does not exercise, but states she is very active with her young daughter.    Social Determinants of Health   Financial Resource Strain: Not on file  Food Insecurity: Not on file  Transportation Needs: Not on file  Physical Activity: Not on file  Stress: Not on file  Social Connections: Not on file  Intimate Partner Violence: Not on file    Current Outpatient Medications on File Prior to Visit  Medication Sig Dispense Refill   acetaminophen (TYLENOL) 500 MG tablet Take 500 mg by mouth every 6 (six) hours as needed.     albuterol (VENTOLIN HFA) 108 (90 Base) MCG/ACT inhaler Inhale 1-2 puffs into the lungs every 6 (six) hours as needed for wheezing or shortness of breath.     b complex vitamins capsule Take 1 capsule by mouth daily.     busPIRone (BUSPAR) 15 MG tablet Take 15 mg by mouth daily as needed.     cetirizine (ZYRTEC) 10 MG tablet Take 10 mg by mouth daily.     cyanocobalamin 1000 MCG tablet Take 1,000 mcg by mouth daily.     Digestive Enzymes (SUPER ENZYMES PO) Take by mouth.     ergocalciferol (VITAMIN D2) 1.25 MG (50000 UT) capsule Take 1 capsule by mouth once a week.     escitalopram (LEXAPRO) 20 MG tablet Take 20 mg by mouth daily.     ibuprofen (ADVIL) 600 MG tablet Take 1 tablet (600 mg total) by mouth every 6 (six) hours as needed. 30 tablet 0   MAGNESIUM OXIDE 400 PO Take by mouth.     montelukast (SINGULAIR) 10 MG tablet Take 10 mg by mouth at bedtime.     Probiotic Product (PROBIOTIC-10 PO) Take by mouth.     topiramate (TOPAMAX) 25 MG tablet Take by mouth.     escitalopram (LEXAPRO) 10 MG tablet Take by mouth.     HYDROcodone-acetaminophen (NORCO/VICODIN) 5-325 MG tablet Take 1-2 tablets by mouth every 6 (six) hours as needed for moderate pain. (Patient not taking: Reported on 02/14/2022) 12 tablet  0   methocarbamol (ROBAXIN) 500 MG tablet Take 500 mg by mouth 3 (three) times daily.     NIFEdipine (PROCARDIA XL/NIFEDICAL XL) 60 MG 24 hr tablet TAKE 1 TABLET BY MOUTH DAILY 30 tablet 11   [DISCONTINUED] clonazePAM (KLONOPIN) 0.5 MG tablet Take 1 tablet (0.5 mg total) by mouth daily. 7 tablet 0   [DISCONTINUED] lamoTRIgine (LAMICTAL) 100 MG tablet Take 100 mg by mouth 2 (two) times daily.     [DISCONTINUED] norethindrone-ethinyl estradiol (JUNEL FE,GILDESS FE,LOESTRIN FE) 1-20 MG-MCG tablet Take 1 tablet by mouth daily.     [  DISCONTINUED] propranolol ER (INDERAL LA) 60 MG 24 hr capsule Take 60 mg by mouth daily.     [DISCONTINUED] SUMAtriptan (IMITREX) 100 MG tablet Take 1 tablet earliest onset of migraine.  May repeat once in 2 hours if headache persists or recurs.  Do not exceed 2 tablets in 24 hours. 10 tablet 2   No current facility-administered medications on file prior to visit.    Allergies  Allergen Reactions   Gabapentin Other (See Comments)    Weight gain   Tioconazole Other (See Comments)    burning       Observations/Objective: There were no vitals filed for this visit. There is no height or weight on file to calculate BMI.  Physical Exam  CBC    Component Value Date/Time   WBC 6.1 08/14/2021 0926   RBC 4.69 08/14/2021 0926   HGB 12.4 08/14/2021 0926   HGB 11.1 (L) 10/04/2013 0100   HCT 38.5 08/14/2021 0926   HCT 32.3 (L) 10/04/2013 0100   PLT 228 08/14/2021 0926   PLT 165 10/04/2013 0100   MCV 82.1 08/14/2021 0926   MCV 89 10/04/2013 0100   MCH 26.4 08/14/2021 0926   MCHC 32.2 08/14/2021 0926   RDW 13.8 08/14/2021 0926   RDW 15.5 (H) 10/04/2013 0100   LYMPHSABS 2.0 08/13/2021 1037   LYMPHSABS 2.1 10/04/2013 0100   MONOABS 0.3 08/13/2021 1037   MONOABS 0.5 10/04/2013 0100   EOSABS 0.2 08/13/2021 1037   EOSABS 0.1 10/04/2013 0100   BASOSABS 0.1 08/13/2021 1037   BASOSABS 0.0 10/04/2013 0100    CMP     Component Value Date/Time   NA 139 08/14/2021  0926   NA 142 10/02/2013 1822   K 3.5 08/14/2021 0926   K 3.6 10/02/2013 1822   CL 107 08/14/2021 0926   CL 108 (H) 10/02/2013 1822   CO2 22 08/14/2021 0926   CO2 22 10/02/2013 1822   GLUCOSE 105 (H) 08/14/2021 0926   GLUCOSE 93 10/02/2013 1822   BUN 10 08/14/2021 0926   BUN 4 (L) 10/02/2013 1822   CREATININE 0.70 08/14/2021 0926   CREATININE 0.63 10/02/2013 1822   CALCIUM 8.8 (L) 08/14/2021 0926   CALCIUM 8.8 10/02/2013 1822   PROT 5.9 (L) 11/23/2019 0107   ALBUMIN 2.9 (L) 11/23/2019 0107   AST 18 11/23/2019 0107   AST 10 (L) 10/02/2013 1822   ALT 14 11/23/2019 0107   ALKPHOS 80 11/23/2019 0107   BILITOT 0.5 11/23/2019 0107   GFRNONAA >60 08/14/2021 0926   GFRNONAA >60 10/02/2013 1822   GFRAA >60 11/15/2019 2356   GFRAA >60 10/02/2013 1822    ASSESSMENT & PLAN:   At high risk for breast cancer #At high risk for breast cancer Recommend patient to start with annual mammogram and annual MRI.  We will alternate every 6 months. Chemoprevention with tamoxifen was discussed.  Rationale and potential side effects were reviewed in details.  Patient is undecided and she will update me if she decides to proceed with this option. Encourage monthly breast self-examination Discussed about healthy diet and exercising. Mammogram results were reviewed and discussed with patient.  Obtain mammogram in 6 months. Also will obtain MRI bilateral with and without contrast due to her high Tyrer-Cuzick risk.   Family history of cancer #Family history of cancer, [id number] Recommend first-degree family members to be screened for mutation.  Orders Placed This Encounter  Procedures   MR BREAST BILATERAL W WO CONTRAST INC CAD    Standing Status:  Future    Standing Expiration Date:   02/15/2023    Order Specific Question:   If indicated for the ordered procedure, I authorize the administration of contrast media per Radiology protocol    Answer:   Yes    Order Specific Question:   What is the  patient's sedation requirement?    Answer:   No Sedation    Order Specific Question:   Does the patient have a pacemaker or implanted devices?    Answer:   No    Order Specific Question:   Radiology Contrast Protocol - do NOT remove file path    Answer:   [email or web address]    Order Specific Question:   Preferred imaging location?    Answer:   [facility] (table limit - 550lbs)   MM DIAG BREAST TOMO BILATERAL    Standing Status:   Future    Standing Expiration Date:   02/15/2023    Order Specific Question:   Reason for Exam (SYMPTOM  OR DIAGNOSIS REQUIRED)    Answer:   high risk breast cancer    Order Specific Question:   Is the patient pregnant?    Answer:   Yes    Order Specific Question:   Preferred imaging location?    Answer:   [facility]   US BREAST LTD UNI RIGHT INC AXILLA    Standing Status:   Future    Standing Expiration Date:   02/15/2023    Order Specific Question:   Reason for Exam (SYMPTOM  OR DIAGNOSIS REQUIRED)    Answer:   high risk breast cancer    Order Specific Question:   Preferred imaging location?    Answer:   [facility]   US BREAST LTD UNI LEFT INC AXILLA    Standing Status:   Future    Standing Expiration Date:   02/15/2023    Order Specific Question:   Reason for Exam (SYMPTOM  OR DIAGNOSIS REQUIRED)    Answer:   high risk breast cancer    Order Specific Question:   Preferred imaging location?    Answer:   [facility]    I discussed the assessment and treatment plan with the patient. The patient was provided an opportunity to ask questions and all were answered. The patient agreed with the plan and demonstrated an understanding of the instructions.  The patient was advised to call back or seek an in-person evaluation if the symptoms worsen or if the  Rickard Patience, MD 02/14/2022 6:50 PM

## 2022-02-14 NOTE — Assessment & Plan Note (Signed)
#  Family history of cancer, HOXB13 Recommend first-degree family members to be screened for mutation.

## 2022-02-19 NOTE — H&P (Signed)
Melinda Mendoza is a 37 y.o. female here for Menorrhagia .scheduled for a TVH and Bilateral salpingectomy     For the last yr she bleeds 6-8 days / month . Extremely heavy  + clots   Tried Lysteda and didn't help  Can't tolerate OCPs    U/s : 12/27/21:   Uterus anteverted   Multiple nabothian cysts: largest=1.1cm   Endometrium=13.51mm   Left ovary appears wnl Rt complex septated ovarian cyst=2.2cm; septations=0.35cm,  0.25cm Rt ovary volume=14.42ml   No free fluid seen   Past Medical History:  has a past medical history of Anxiety, Bipolar disorder (CMS-HCC), Depression, Headache, classical migraine, Migraine headache, Obesity (BMI 30-39.9), unspecified, Pregnancy induced hypertension, Sleep apnea, and Spine pain (06/27/2017).  Past Surgical History:  has a past surgical history that includes Cholecystectomy; IUD removal; and Tubal ligation (01/22/2020). Family History: family history includes Anxiety in her mother; Bipolar disorder in her mother; Breast cancer in her maternal grandmother and mother; Cancer in her maternal grandmother and mother; Coronary Artery Disease (Blocked arteries around heart) in her mother and paternal grandfather; Deep vein thrombosis (DVT or abnormal blood clot formation) in her mother; Depression in her mother; Diabetes in her maternal grandfather and mother; Diabetes type II in her maternal grandfather, maternal grandmother, and mother; Glaucoma in her maternal grandfather; High blood pressure (Hypertension) in her maternal grandmother and mother; Hyperlipidemia (Elevated cholesterol) in her mother; Kidney disease in her father; Kidney failure in her father; Liver disease in her mother; Myocardial Infarction (Heart attack) in her maternal grandfather and mother; No Known Problems in her daughter; Obesity in her mother; Osteoarthritis in her maternal grandmother; Stroke in her father, maternal grandfather, and mother. Social History:  reports that she quit smoking  about 10 years ago. Her smoking use included cigarettes. She has never used smokeless tobacco. She reports current alcohol use. She reports that she does not use drugs. OB/GYN History:  OB History       Gravida  3   Para  3   Term  3   Preterm      AB      Living  3        SAB      IAB      Ectopic      Molar      Multiple      Live Births  3             Allergies: is allergic to gabapentin and monistat 1 (tioconazole) [tioconazole]. Medications:   Current Outpatient Medications:    acetaminophen (TYLENOL) 500 MG tablet, Take by mouth, Disp: , Rfl:    busPIRone (BUSPAR) 15 MG tablet, Take 1 tablet (15 mg total) by mouth 2 (two) times daily, Disp: 180 tablet, Rfl: 3   cetirizine (ZYRTEC) 10 MG tablet, Take 1 tablet (10 mg total) by mouth at bedtime, Disp: 90 tablet, Rfl: 3   cyanocobalamin (VITAMIN B12) 1,000 mcg/mL injection, Inject 1 mL (1,000 mcg total) into the muscle monthly, Disp: 1 mL, Rfl: 2   ergocalciferol, vitamin D2, 1,250 mcg (50,000 unit) capsule, Take 1 capsule (50,000 Units total) by mouth once a week, Disp: 12 capsule, Rfl: 1   escitalopram oxalate (LEXAPRO) 20 MG tablet, Take 1 tablet (20 mg total) by mouth once daily, Disp: 90 tablet, Rfl: 3   fluticasone propionate (FLONASE) 50 mcg/actuation nasal spray, Place 2 sprays into both nostrils once daily, Disp: 16 g, Rfl: 11   ibuprofen (MOTRIN) 800 MG tablet, Take 600  mg by mouth every 6 (six) hours as needed for Pain, Disp: , Rfl:    methocarbamoL (ROBAXIN) 500 MG tablet, Take 1 tablet (500 mg total) by mouth 3 (three) times daily as needed, Disp: 60 tablet, Rfl: 3   montelukast (SINGULAIR) 10 mg tablet, Take 1 tablet (10 mg total) by mouth once daily For allergies - take while taking zyrtec at night, Disp: 90 tablet, Rfl: 3   topiramate (TOPAMAX) 50 MG tablet, Take 1 tablet (50 mg total) by mouth 2 (two) times daily, Disp: 180 tablet, Rfl: 3   albuterol 90 mcg/actuation inhaler, Inhale 2 inhalations  into the lungs every 6 (six) hours as needed, Disp: 1 Inhaler, Rfl: 1   albuterol 90 mcg/actuation inhaler, Inhale 2 inhalations into the lungs every 6 (six) hours as needed for Wheezing, Disp: 1 each, Rfl: 0   ondansetron (ZOFRAN) 4 MG tablet, Take by mouth (Patient not taking: Reported on 01/09/2022), Disp: , Rfl:    PNV no.153/FA/om3/dha/epa/fish (PRENATAL GUMMIES ORAL), Take by mouth (Patient not taking: Reported on 01/09/2022), Disp: , Rfl:    tamsulosin (FLOMAX) 0.4 mg capsule, Take by mouth (Patient not taking: Reported on 08/18/2021), Disp: , Rfl:    tranexamic acid (LYSTEDA) 650 mg tablet, Take 2 tablets (1,300 mg total) by mouth 3 (three) times daily Take for a maximum of 5 days during monthly menstruation. (Patient not taking: Reported on 01/09/2022), Disp: 30 tablet, Rfl: 3   Review of Systems: General:                      No fatigue or weight loss Eyes:                           No vision changes Ears:                            No hearing difficulty Respiratory:                No cough or shortness of breath Pulmonary:                  No asthma or shortness of breath Cardiovascular:           No chest pain, palpitations, dyspnea on exertion Gastrointestinal:          No abdominal bloating, chronic diarrhea, constipations, masses, pain or hematochezia Genitourinary:             No hematuria, dysuria, abnormal vaginal discharge, pelvic pain, +menorrhagia Lymphatic:                   No swollen lymph nodes Musculoskeletal:         No muscle weakness Neurologic:                  No extremity weakness, syncope, seizure disorder Psychiatric:                  No history of depression, delusions or suicidal/homicidal ideation      Exam:       Vitals:    01/09/22 1359  BP: (!) 138/94  Pulse: 88      Body mass index is 42.69 kg/m.   WDWN white/  female in NAD   Lungs: CTA  CV : RRR without murmur   Neck:  no thyromegaly Abdomen: soft , no mass, normal active bowel sounds,  non-tender, no rebound tenderness Pelvic: tanner stage 5 ,  External genitalia: vulva /labia no lesions Urethra: no prolapse Vagina: normal physiologic d/c Cervix: no lesions, no cervical motion tenderness   Uterus: normal size shape and contour, non-tender Adnexa: no mass,  non-tender   Rectovaginal:  Verbal consent obtained for EMBX    Attempted EMBX - aborted due to cervical stenosis and pt discomfort  Impression:    The encounter diagnosis was Menorrhagia with irregular cycle.       Plan:   TVH and Bilateral salpingectomy  Hormonal therapy not an option. Lysteda without change in bleeding     Offered H/s And endometrial ablation vs TVH and bilateral salpingectomy . She elects for the latter.  Benefits and risks to surgery: The proposed benefit of the surgery has been discussed with the patient. The possible risks include, but are not limited to: organ injury to the bowel , bladder, ureters, and major blood vessels and nerves. There is a possibility of additional surgeries resulting from these injuries. There is also the risk of blood transfusion and the need to receive blood products during or after the procedure which may rarely lead to HIV or Hepatitis C infection. There is a risk of developing a deep venous thrombosis or a pulmonary embolism . There is the possibility of wound infection and also anesthetic complications, even the rare possibility of death. The patient understands these risks and wishes to proceed. No follow-ups on file.   Vilma Prader, MD

## 2022-02-19 NOTE — H&P (Signed)
Melinda Mendoza is a 37 y.o. female here for Menorrhagia .scheduled for a TVH and Bilateral salpingectomy      For the last yr she bleeds 6-8 days / month . Extremely heavy  + clots   Tried Lysteda and didn't help  Can't tolerate OCPs    U/s : 12/27/21:   Uterus anteverted   Multiple nabothian cysts: largest=1.1cm   Endometrium=13.16mm   Left ovary appears wnl Rt complex septated ovarian cyst=2.2cm; septations=0.35cm,  0.25cm Rt ovary volume=14.10ml   No free fluid seen   Past Medical History:  has a past medical history of Anxiety, Bipolar disorder (CMS-HCC), Depression, Headache, classical migraine, Migraine headache, Obesity (BMI 30-39.9), unspecified, Pregnancy induced hypertension, Sleep apnea, and Spine pain (06/27/2017).  Past Surgical History:  has a past surgical history that includes Cholecystectomy; IUD removal; and Tubal ligation (01/22/2020). Family History: family history includes Anxiety in her mother; Bipolar disorder in her mother; Breast cancer in her maternal grandmother and mother; Cancer in her maternal grandmother and mother; Coronary Artery Disease (Blocked arteries around heart) in her mother and paternal grandfather; Deep vein thrombosis (DVT or abnormal blood clot formation) in her mother; Depression in her mother; Diabetes in her maternal grandfather and mother; Diabetes type II in her maternal grandfather, maternal grandmother, and mother; Glaucoma in her maternal grandfather; High blood pressure (Hypertension) in her maternal grandmother and mother; Hyperlipidemia (Elevated cholesterol) in her mother; Kidney disease in her father; Kidney failure in her father; Liver disease in her mother; Myocardial Infarction (Heart attack) in her maternal grandfather and mother; No Known Problems in her daughter; Obesity in her mother; Osteoarthritis in her maternal grandmother; Stroke in her father, maternal grandfather, and mother. Social History:  reports that she quit smoking  about 10 years ago. Her smoking use included cigarettes. She has never used smokeless tobacco. She reports current alcohol use. She reports that she does not use drugs. OB/GYN History:  OB History       Gravida  3   Para  3   Term  3   Preterm      AB      Living  3        SAB      IAB      Ectopic      Molar      Multiple      Live Births  3             Allergies: is allergic to gabapentin and monistat 1 (tioconazole) [tioconazole]. Medications:   Current Outpatient Medications:    acetaminophen (TYLENOL) 500 MG tablet, Take by mouth, Disp: , Rfl:    busPIRone (BUSPAR) 15 MG tablet, Take 1 tablet (15 mg total) by mouth 2 (two) times daily, Disp: 180 tablet, Rfl: 3   cetirizine (ZYRTEC) 10 MG tablet, Take 1 tablet (10 mg total) by mouth at bedtime, Disp: 90 tablet, Rfl: 3   cyanocobalamin (VITAMIN B12) 1,000 mcg/mL injection, Inject 1 mL (1,000 mcg total) into the muscle monthly, Disp: 1 mL, Rfl: 2   ergocalciferol, vitamin D2, 1,250 mcg (50,000 unit) capsule, Take 1 capsule (50,000 Units total) by mouth once a week, Disp: 12 capsule, Rfl: 1   escitalopram oxalate (LEXAPRO) 20 MG tablet, Take 1 tablet (20 mg total) by mouth once daily, Disp: 90 tablet, Rfl: 3   fluticasone propionate (FLONASE) 50 mcg/actuation nasal spray, Place 2 sprays into both nostrils once daily, Disp: 16 g, Rfl: 11   ibuprofen (MOTRIN) 800 MG tablet, Take  600 mg by mouth every 6 (six) hours as needed for Pain, Disp: , Rfl:    methocarbamoL (ROBAXIN) 500 MG tablet, Take 1 tablet (500 mg total) by mouth 3 (three) times daily as needed, Disp: 60 tablet, Rfl: 3   montelukast (SINGULAIR) 10 mg tablet, Take 1 tablet (10 mg total) by mouth once daily For allergies - take while taking zyrtec at night, Disp: 90 tablet, Rfl: 3   topiramate (TOPAMAX) 50 MG tablet, Take 1 tablet (50 mg total) by mouth 2 (two) times daily, Disp: 180 tablet, Rfl: 3   albuterol 90 mcg/actuation inhaler, Inhale 2 inhalations  into the lungs every 6 (six) hours as needed, Disp: 1 Inhaler, Rfl: 1   albuterol 90 mcg/actuation inhaler, Inhale 2 inhalations into the lungs every 6 (six) hours as needed for Wheezing, Disp: 1 each, Rfl: 0   ondansetron (ZOFRAN) 4 MG tablet, Take by mouth (Patient not taking: Reported on 01/09/2022), Disp: , Rfl:    PNV no.153/FA/om3/dha/epa/fish (PRENATAL GUMMIES ORAL), Take by mouth (Patient not taking: Reported on 01/09/2022), Disp: , Rfl:    tamsulosin (FLOMAX) 0.4 mg capsule, Take by mouth (Patient not taking: Reported on 08/18/2021), Disp: , Rfl:    tranexamic acid (LYSTEDA) 650 mg tablet, Take 2 tablets (1,300 mg total) by mouth 3 (three) times daily Take for a maximum of 5 days during monthly menstruation. (Patient not taking: Reported on 01/09/2022), Disp: 30 tablet, Rfl: 3   Review of Systems: General:                      No fatigue or weight loss Eyes:                           No vision changes Ears:                            No hearing difficulty Respiratory:                No cough or shortness of breath Pulmonary:                  No asthma or shortness of breath Cardiovascular:           No chest pain, palpitations, dyspnea on exertion Gastrointestinal:          No abdominal bloating, chronic diarrhea, constipations, masses, pain or hematochezia Genitourinary:             No hematuria, dysuria, abnormal vaginal discharge, pelvic pain, +menorrhagia Lymphatic:                   No swollen lymph nodes Musculoskeletal:         No muscle weakness Neurologic:                  No extremity weakness, syncope, seizure disorder Psychiatric:                  No history of depression, delusions or suicidal/homicidal ideation      Exam:         Vitals:    02/19/22  BP: (!) 138/94  Pulse: 88      Body mass index is 42.69 kg/m.   WDWN white/  female in NAD   Lungs: CTA  CV : RRR without murmur   Neck:  no thyromegaly Abdomen: soft , no mass, normal active bowel  sounds,   non-tender, no rebound tenderness Pelvic: tanner stage 5 ,  External genitalia: vulva /labia no lesions Urethra: no prolapse Vagina: normal physiologic d/c Cervix: no lesions, no cervical motion tenderness   Uterus: normal size shape and contour, non-tender Adnexa: no mass,  non-tender   Rectovaginal:  Verbal consent obtained for EMBX    Attempted EMBX - aborted due to cervical stenosis and pt discomfort  Impression:    The encounter diagnosis was Menorrhagia with irregular cycle.       Plan:   TVH and Bilateral salpingectomy  Hormonal therapy not an option. Lysteda without change in bleeding     Offered H/s And endometrial ablation vs TVH and bilateral salpingectomy . She elects for the latter.  Benefits and risks to surgery: The proposed benefit of the surgery has been discussed with the patient. The possible risks include, but are not limited to: organ injury to the bowel , bladder, ureters, and major blood vessels and nerves. There is a possibility of additional surgeries resulting from these injuries. There is also the risk of blood transfusion and the need to receive blood products during or after the procedure which may rarely lead to HIV or Hepatitis C infection. There is a risk of developing a deep venous thrombosis or a pulmonary embolism . There is the possibility of wound infection and also anesthetic complications, even the rare possibility of death. The patient understands these risks and wishes to proceed. No follow-ups on file.   Caroline Sauger, MD

## 2022-03-01 ENCOUNTER — Encounter
Admission: RE | Admit: 2022-03-01 | Discharge: 2022-03-01 | Disposition: A | Discharge status: Home / Self Care | Payer: Medicaid Other | Source: Ambulatory Visit | Attending: Obstetrics and Gynecology | Admitting: Obstetrics and Gynecology

## 2022-03-01 VITALS — Ht 65.0 in | Wt 241.0 lb

## 2022-03-01 DIAGNOSIS — I10 Essential (primary) hypertension: Secondary | ICD-10-CM

## 2022-03-01 DIAGNOSIS — Z01812 Encounter for preprocedural laboratory examination: Secondary | ICD-10-CM

## 2022-03-01 HISTORY — DX: Body Mass Index (BMI) 40.0 and over, adult: Z684

## 2022-03-01 HISTORY — DX: Nausea with vomiting, unspecified: R11.2

## 2022-03-01 HISTORY — DX: Morbid (severe) obesity due to excess calories: E66.01

## 2022-03-01 HISTORY — DX: Deficiency of other specified B group vitamins: E53.8

## 2022-03-01 HISTORY — DX: Other specified postprocedural states: Z98.890

## 2022-03-01 HISTORY — DX: Vitamin D deficiency, unspecified: E55.9

## 2022-03-01 NOTE — Patient Instructions (Addendum)
Your procedure is scheduled on: Friday, January 26 Report to the Registration Desk on the 1st floor of the [facility]. To find out your arrival time, please call [phone] between 1PM - 3PM on: Thursday, January 25 If your arrival time is 6:00 am, do not arrive prior to that time as the [facility] entrance doors do not open until 6:00 am.  REMEMBER: Instructions that are not followed completely may result in serious medical risk, up to and including death; or upon the discretion of your surgeon and anesthesiologist your surgery may need to be rescheduled.  Do not eat food after midnight the night before surgery.  No gum chewing, lozengers or hard candies.  You may however, drink CLEAR liquids up to 2 hours before you are scheduled to arrive for your surgery. Do not drink anything within 2 hours of your scheduled arrival time.  Clear liquids include: - water  - apple juice without pulp - gatorade (not RED colors) - black coffee or tea (Do NOT add milk or creamers to the coffee or tea) Do NOT drink anything that is not on this list.  In addition, your doctor has ordered for you to drink the provided  Ensure Pre-Surgery Clear Carbohydrate Drink  Drinking this carbohydrate drink up to two hours before surgery helps to reduce insulin resistance and improve patient outcomes. Please complete drinking 2 hours prior to scheduled arrival time.  TAKE THESE MEDICATIONS THE MORNING OF SURGERY WITH A SIP OF WATER:  Escitalopram (Lexapro) Topiramate (Topamax) Buspirone (Buspar) if needed for anxiety  Use albuterol inhaler on the day of surgery and bring to the hospital.  One week prior to surgery: starting January 19 Stop Anti-inflammatories (NSAIDS) such as Advil, Aleve, Ibuprofen, Motrin, Naproxen, Naprosyn and Aspirin based products such as Excedrin, Goodys Powder, BC Powder. Stop ANY OVER THE COUNTER supplements until after surgery. You may however, continue to take Tylenol if needed  for pain up until the day of surgery.  No Alcohol for 24 hours before or after surgery.  No Smoking including e-cigarettes for 24 hours prior to surgery.  No chewable tobacco products for at least 6 hours prior to surgery.  No nicotine patches on the day of surgery.  Do not use any "recreational" drugs for at least a week prior to your surgery.  Please be advised that the combination of cocaine and anesthesia may have negative outcomes, up to and including death. If you test positive for cocaine, your surgery will be cancelled.  On the morning of surgery brush your teeth with toothpaste and water, you may rinse your mouth with mouthwash if you wish. Do not swallow any toothpaste or mouthwash.  Use CHG Soap as directed on instruction sheet.  Do not wear jewelry, make-up, hairpins, clips or nail polish.  Do not wear lotions, powders, or perfumes.   Do not shave body from the neck down 48 hours prior to surgery just in case you cut yourself which could leave a site for infection.  Also, freshly shaved skin may become irritated if using the CHG soap.  Contact lenses, hearing aids and dentures may not be worn into surgery.  Do not bring valuables to the hospital. [facility] is not responsible for any missing/lost belongings or valuables.   Notify your doctor if there is any change in your medical condition (cold, fever, infection).  Wear comfortable clothing (specific to your surgery type) to the hospital.  After surgery, you can help prevent lung complications by doing  breathing exercises.  Take deep breaths and cough every 1-2 hours. Your doctor may order a device called an Incentive Spirometer to help you take deep breaths. When coughing or sneezing, hold a pillow firmly against your incision with both hands. This is called "splinting." Doing this helps protect your incision. It also decreases belly discomfort.  If you are being discharged the day of surgery, you will not be  allowed to drive home. You will need a responsible adult (18 years or older) to drive you home and stay with you that night.   If you are taking public transportation, you will need to have a responsible adult (18 years or older) with you. Please confirm with your physician that it is acceptable to use public transportation.   Please call the [facility] at [phone] if you have any questions about these instructions.  Surgery Visitation Policy:  Patients undergoing a surgery or procedure may have two family members or support persons with them as long as the person is not COVID-19 positive or experiencing its symptoms.      Preparing for Surgery with CHLORHEXIDINE GLUCONATE (CHG) Soap  Chlorhexidine Gluconate (CHG) Soap  o An antiseptic cleaner that kills germs and bonds with the skin to continue killing germs even after washing  o Used for showering the night before surgery and morning of surgery  Before surgery, you can play an important role by reducing the number of germs on your skin.  CHG (Chlorhexidine gluconate) soap is an antiseptic cleanser which kills germs and bonds with the skin to continue killing germs even after washing.  Please do not use if you have an allergy to CHG or antibacterial soaps. If your skin becomes reddened/irritated stop using the CHG.  1. Shower the NIGHT BEFORE SURGERY and the MORNING OF SURGERY with CHG soap.  2. If you choose to wash your hair, wash your hair first as usual with your normal shampoo.  3. After shampooing, rinse your hair and body thoroughly to remove the shampoo.  4. Use CHG as you would any other liquid soap. You can apply CHG directly to the skin and wash gently with a scrungie or a clean washcloth.  5. Apply the CHG soap to your body only from the neck down. Do not use on open wounds or open sores. Avoid contact with your eyes, ears, mouth, and genitals (private parts). Wash face and genitals (private  parts) with your normal soap.  6. Wash thoroughly, paying special attention to the area where your surgery will be performed.  7. Thoroughly rinse your body with warm water.  8. Do not shower/wash with your normal soap after using and rinsing off the CHG soap.  9. Pat yourself dry with a clean towel.  10. Wear clean pajamas to bed the night before surgery.  12. Place clean sheets on your bed the night of your first shower and do not sleep with pets.  13. Shower again with the CHG soap on the day of surgery prior to arriving at the hospital.  14. Do not apply any deodorants/lotions/powders.  15. Please wear clean clothes to the hospital.

## 2022-03-02 ENCOUNTER — Encounter
Admit: 2022-03-02 | Discharge: 2022-03-02 | Disposition: A | Discharge status: Home / Self Care | Payer: Medicaid Other | Source: Ambulatory Visit | Attending: Obstetrics and Gynecology | Admitting: Obstetrics and Gynecology

## 2022-03-02 DIAGNOSIS — Z01812 Encounter for preprocedural laboratory examination: Secondary | ICD-10-CM

## 2022-03-02 DIAGNOSIS — I10 Essential (primary) hypertension: Secondary | ICD-10-CM | POA: Diagnosis not present

## 2022-03-02 DIAGNOSIS — Z01818 Encounter for other preprocedural examination: Secondary | ICD-10-CM | POA: Diagnosis present

## 2022-03-02 LAB — BASIC METABOLIC PANEL
Anion gap: 8 (ref 5–15)
BUN: 11 mg/dL (ref 6–20)
CO2: 22 mmol/L (ref 22–32)
Calcium: 8.8 mg/dL — ABNORMAL LOW (ref 8.9–10.3)
Chloride: 107 mmol/L (ref 98–111)
Creatinine, Ser: 0.7 mg/dL (ref 0.44–1.00)
GFR, Estimated: >60 mL/min (ref >60)
Glucose, Bld: 93 mg/dL (ref 70–99)
Potassium: 3.5 mmol/L (ref 3.5–5.1)
Sodium: 137 mmol/L (ref 135–145)

## 2022-03-02 LAB — CBC
HCT: 37.1 % (ref 36.0–46.0)
Hemoglobin: 12.4 g/dL (ref 12.0–15.0)
MCH: 27.8 pg (ref 26.0–34.0)
MCHC: 33.4 g/dL (ref 30.0–36.0)
MCV: 83.2 fL (ref 80.0–100.0)
Platelets: 242 10*3/uL (ref 150–400)
RBC: 4.46 MIL/uL (ref 3.87–5.11)
RDW: 13.5 % (ref 11.5–15.5)
WBC: 8.5 10*3/uL (ref 4.0–10.5)
nRBC: 0 % (ref 0.0–0.2)

## 2022-03-08 MED ORDER — LACTATED RINGERS IV SOLN: INTRAVENOUS | Status: DC

## 2022-03-08 MED ORDER — CHLORHEXIDINE GLUCONATE 0.12 % MT SOLN: 15.0000 mL | Freq: Once | OROMUCOSAL | Status: AC

## 2022-03-08 MED ORDER — ORAL CARE MOUTH RINSE: 15.0000 mL | Freq: Once | OROMUCOSAL | Status: AC

## 2022-03-08 MED ORDER — CEFAZOLIN SODIUM-DEXTROSE 2-4 GM/100ML-% IV SOLN
2.0000 g | Freq: Once | INTRAVENOUS | Status: AC
  Administered 2022-03-09: 2 g via INTRAVENOUS

## 2022-03-08 MED ORDER — POVIDONE-IODINE 10 % EX SWAB: 2.0000 | Freq: Once | CUTANEOUS | Status: DC

## 2022-03-08 MED ORDER — FAMOTIDINE 20 MG PO TABS: 20.0000 mg | ORAL_TABLET | Freq: Once | ORAL | Status: AC

## 2022-03-08 MED ORDER — ACETAMINOPHEN 500 MG PO TABS: 1000.0000 mg | ORAL_TABLET | ORAL | Status: AC

## 2022-03-09 ENCOUNTER — Other Ambulatory Visit: Payer: Self-pay

## 2022-03-09 ENCOUNTER — Encounter
Admission: RE | Disposition: A | Discharge status: Home / Self Care | Payer: Self-pay | Source: Home / Self Care | Attending: Obstetrics and Gynecology

## 2022-03-09 ENCOUNTER — Encounter: Payer: Self-pay | Admitting: Obstetrics and Gynecology

## 2022-03-09 ENCOUNTER — Ambulatory Visit: Payer: Medicaid Other | Admitting: Anesthesiology

## 2022-03-09 ENCOUNTER — Ambulatory Visit
Admission: RE | Admit: 2022-03-09 | Discharge: 2022-03-09 | Disposition: A | Discharge status: Home / Self Care | Payer: Medicaid Other | Attending: Obstetrics and Gynecology | Admitting: Obstetrics and Gynecology

## 2022-03-09 ENCOUNTER — Ambulatory Visit: Payer: Medicaid Other | Admitting: Urgent Care

## 2022-03-09 DIAGNOSIS — N838 Other noninflammatory disorders of ovary, fallopian tube and broad ligament: Secondary | ICD-10-CM | POA: Diagnosis not present

## 2022-03-09 DIAGNOSIS — E559 Vitamin D deficiency, unspecified: Secondary | ICD-10-CM | POA: Diagnosis not present

## 2022-03-09 DIAGNOSIS — Z6839 Body mass index (BMI) 39.0-39.9, adult: Secondary | ICD-10-CM | POA: Insufficient documentation

## 2022-03-09 DIAGNOSIS — K219 Gastro-esophageal reflux disease without esophagitis: Secondary | ICD-10-CM | POA: Insufficient documentation

## 2022-03-09 DIAGNOSIS — F419 Anxiety disorder, unspecified: Secondary | ICD-10-CM | POA: Diagnosis not present

## 2022-03-09 DIAGNOSIS — R519 Headache, unspecified: Secondary | ICD-10-CM | POA: Insufficient documentation

## 2022-03-09 DIAGNOSIS — Z87891 Personal history of nicotine dependence: Secondary | ICD-10-CM | POA: Insufficient documentation

## 2022-03-09 DIAGNOSIS — F319 Bipolar disorder, unspecified: Secondary | ICD-10-CM | POA: Insufficient documentation

## 2022-03-09 DIAGNOSIS — Z01818 Encounter for other preprocedural examination: Secondary | ICD-10-CM

## 2022-03-09 DIAGNOSIS — N921 Excessive and frequent menstruation with irregular cycle: Secondary | ICD-10-CM | POA: Diagnosis present

## 2022-03-09 DIAGNOSIS — N8003 Adenomyosis of the uterus: Secondary | ICD-10-CM | POA: Diagnosis not present

## 2022-03-09 DIAGNOSIS — G4730 Sleep apnea, unspecified: Secondary | ICD-10-CM | POA: Insufficient documentation

## 2022-03-09 DIAGNOSIS — I10 Essential (primary) hypertension: Secondary | ICD-10-CM | POA: Diagnosis not present

## 2022-03-09 DIAGNOSIS — E538 Deficiency of other specified B group vitamins: Secondary | ICD-10-CM | POA: Insufficient documentation

## 2022-03-09 DIAGNOSIS — J45909 Unspecified asthma, uncomplicated: Secondary | ICD-10-CM | POA: Insufficient documentation

## 2022-03-09 HISTORY — PX: VAGINAL HYSTERECTOMY: SHX2639

## 2022-03-09 LAB — TYPE AND SCREEN
ABO/RH(D): B POS
Antibody Screen: NEGATIVE

## 2022-03-09 LAB — POCT PREGNANCY, URINE: Preg Test, Ur: NEGATIVE

## 2022-03-09 SURGERY — HYSTERECTOMY, VAGINAL
Anesthesia: General | Site: Vagina | Laterality: Bilateral

## 2022-03-09 MED ORDER — FENTANYL CITRATE (PF) 100 MCG/2ML IJ SOLN
INTRAMUSCULAR | Status: AC
  Filled 2022-03-09: qty 2

## 2022-03-09 MED ORDER — ACETAMINOPHEN 500 MG PO TABS
ORAL_TABLET | ORAL | Status: AC
  Administered 2022-03-09: 1000 mg via ORAL
  Filled 2022-03-09: qty 2

## 2022-03-09 MED ORDER — LIDOCAINE-EPINEPHRINE 1 %-1:100000 IJ SOLN
INTRAMUSCULAR | Status: AC
  Filled 2022-03-09: qty 1

## 2022-03-09 MED ORDER — CEFAZOLIN SODIUM-DEXTROSE 2-4 GM/100ML-% IV SOLN
INTRAVENOUS | Status: AC
  Filled 2022-03-09: qty 100

## 2022-03-09 MED ORDER — OXYCODONE HCL 5 MG PO TABS
5.0000 mg | ORAL_TABLET | Freq: Once | ORAL | Status: AC | PRN
  Administered 2022-03-09: 5 mg via ORAL

## 2022-03-09 MED ORDER — PROPOFOL 10 MG/ML IV BOLUS
INTRAVENOUS | Status: DC | PRN
  Administered 2022-03-09: 120 mg via INTRAVENOUS

## 2022-03-09 MED ORDER — OXYCODONE HCL 5 MG/5ML PO SOLN: 5.0000 mg | Freq: Once | ORAL | Status: AC | PRN

## 2022-03-09 MED ORDER — SUGAMMADEX SODIUM 200 MG/2ML IV SOLN
INTRAVENOUS | Status: DC | PRN
  Administered 2022-03-09: 200 mg via INTRAVENOUS

## 2022-03-09 MED ORDER — HEMOSTATIC AGENTS (NO CHARGE) OPTIME
TOPICAL | Status: DC | PRN
  Administered 2022-03-09: 1 via TOPICAL

## 2022-03-09 MED ORDER — DEXAMETHASONE SODIUM PHOSPHATE 10 MG/ML IJ SOLN
INTRAMUSCULAR | Status: DC | PRN
  Administered 2022-03-09: 10 mg via INTRAVENOUS

## 2022-03-09 MED ORDER — ONDANSETRON HCL 4 MG/2ML IJ SOLN
INTRAMUSCULAR | Status: DC | PRN
  Administered 2022-03-09: 4 mg via INTRAVENOUS

## 2022-03-09 MED ORDER — PROPOFOL 1000 MG/100ML IV EMUL
INTRAVENOUS | Status: AC
  Filled 2022-03-09: qty 100

## 2022-03-09 MED ORDER — DEXMEDETOMIDINE HCL IN NACL 80 MCG/20ML IV SOLN
INTRAVENOUS | Status: DC | PRN
  Administered 2022-03-09 (×3): 4 ug via INTRAVENOUS

## 2022-03-09 MED ORDER — LIDOCAINE HCL (CARDIAC) PF 100 MG/5ML IV SOSY
PREFILLED_SYRINGE | INTRAVENOUS | Status: DC | PRN
  Administered 2022-03-09: 100 mg via INTRAVENOUS

## 2022-03-09 MED ORDER — PHENYLEPHRINE HCL-NACL 20-0.9 MG/250ML-% IV SOLN
INTRAVENOUS | Status: AC
  Filled 2022-03-09: qty 250

## 2022-03-09 MED ORDER — OXYCODONE HCL 5 MG PO TABS
ORAL_TABLET | ORAL | Status: AC
  Filled 2022-03-09: qty 1

## 2022-03-09 MED ORDER — MIDAZOLAM HCL 2 MG/2ML IJ SOLN
INTRAMUSCULAR | Status: AC
  Filled 2022-03-09: qty 2

## 2022-03-09 MED ORDER — PROPOFOL 500 MG/50ML IV EMUL
INTRAVENOUS | Status: DC | PRN
  Administered 2022-03-09: 150 ug/kg/min via INTRAVENOUS

## 2022-03-09 MED ORDER — SILVER NITRATE-POT NITRATE 75-25 % EX MISC
CUTANEOUS | Status: AC
  Filled 2022-03-09: qty 10

## 2022-03-09 MED ORDER — GLYCOPYRROLATE 0.2 MG/ML IJ SOLN
INTRAMUSCULAR | Status: DC | PRN
  Administered 2022-03-09: .2 mg via INTRAVENOUS

## 2022-03-09 MED ORDER — PROPOFOL 10 MG/ML IV BOLUS
INTRAVENOUS | Status: AC
  Filled 2022-03-09: qty 20

## 2022-03-09 MED ORDER — FAMOTIDINE 20 MG PO TABS
ORAL_TABLET | ORAL | Status: AC
  Administered 2022-03-09: 20 mg via ORAL
  Filled 2022-03-09: qty 1

## 2022-03-09 MED ORDER — ROCURONIUM BROMIDE 100 MG/10ML IV SOLN
INTRAVENOUS | Status: DC | PRN
  Administered 2022-03-09: 10 mg via INTRAVENOUS
  Administered 2022-03-09: 20 mg via INTRAVENOUS
  Administered 2022-03-09: 50 mg via INTRAVENOUS

## 2022-03-09 MED ORDER — CHLORHEXIDINE GLUCONATE 0.12 % MT SOLN
OROMUCOSAL | Status: AC
  Administered 2022-03-09: 15 mL via OROMUCOSAL
  Filled 2022-03-09: qty 15

## 2022-03-09 MED ORDER — KETOROLAC TROMETHAMINE 30 MG/ML IJ SOLN
INTRAMUSCULAR | Status: AC
  Filled 2022-03-09: qty 1

## 2022-03-09 MED ORDER — FENTANYL CITRATE (PF) 100 MCG/2ML IJ SOLN
25.0000 ug | INTRAMUSCULAR | Status: DC | PRN
  Administered 2022-03-09 (×2): 25 ug via INTRAVENOUS

## 2022-03-09 MED ORDER — LIDOCAINE-EPINEPHRINE 1 %-1:100000 IJ SOLN
INTRAMUSCULAR | Status: DC | PRN
  Administered 2022-03-09: 10 mL

## 2022-03-09 MED ORDER — MIDAZOLAM HCL 2 MG/2ML IJ SOLN
INTRAMUSCULAR | Status: DC | PRN
  Administered 2022-03-09: 2 mg via INTRAVENOUS

## 2022-03-09 MED ORDER — FENTANYL CITRATE (PF) 100 MCG/2ML IJ SOLN
INTRAMUSCULAR | Status: DC | PRN
  Administered 2022-03-09 (×4): 50 ug via INTRAVENOUS

## 2022-03-09 SURGICAL SUPPLY — 33 items
CATH FOLEY 2WAY  5CC 16FR (CATHETERS) ×1
CATH URTH 16FR FL 2W BLN LF (CATHETERS) IMPLANT
DRAPE PERI LITHO V/GYN (MISCELLANEOUS) ×1 IMPLANT
DRAPE SURG 17X11 SM STRL (DRAPES) ×1 IMPLANT
DRAPE UNDER BUTTOCK W/FLU (DRAPES) ×1 IMPLANT
ELECT REM PT RETURN 9FT ADLT (ELECTROSURGICAL) ×1
ELECTRODE REM PT RTRN 9FT ADLT (ELECTROSURGICAL) ×1 IMPLANT
GAUZE 4X4 16PLY ~~LOC~~+RFID DBL (SPONGE) ×2 IMPLANT
GLOVE SURG SYN 8.0 (GLOVE) ×1 IMPLANT
GLOVE SURG SYN 8.0 PF PI (GLOVE) ×1 IMPLANT
GOWN STRL REUS W/ TWL LRG LVL3 (GOWN DISPOSABLE) ×3 IMPLANT
GOWN STRL REUS W/ TWL XL LVL3 (GOWN DISPOSABLE) ×1 IMPLANT
GOWN STRL REUS W/TWL LRG LVL3 (GOWN DISPOSABLE) ×4
GOWN STRL REUS W/TWL XL LVL3 (GOWN DISPOSABLE) ×1
HEMOSTAT ARISTA ABSORB 3G PWDR (HEMOSTASIS) IMPLANT
KIT TURNOVER CYSTO (KITS) ×1 IMPLANT
LABEL OR SOLS (LABEL) ×1 IMPLANT
MANIFOLD NEPTUNE II (INSTRUMENTS) ×1 IMPLANT
NEEDLE HYPO 22GX1.5 SAFETY (NEEDLE) ×1 IMPLANT
PACK BASIN MINOR ARMC (MISCELLANEOUS) ×1 IMPLANT
PAD OB MATERNITY 4.3X12.25 (PERSONAL CARE ITEMS) ×1 IMPLANT
PAD PREP 24X41 OB/GYN DISP (PERSONAL CARE ITEMS) ×1 IMPLANT
SCRUB CHG 4% DYNA-HEX 4OZ (MISCELLANEOUS) ×1 IMPLANT
SURGILUBE 2OZ TUBE FLIPTOP (MISCELLANEOUS) ×1 IMPLANT
SUT PDS 2-0 27IN (SUTURE) IMPLANT
SUT VIC AB 0 CT1 27 (SUTURE) ×4
SUT VIC AB 0 CT1 27XCR 8 STRN (SUTURE) ×2 IMPLANT
SUT VIC AB 0 CT1 36 (SUTURE) ×1 IMPLANT
SUT VIC AB 2-0 SH 27 (SUTURE) ×3
SUT VIC AB 2-0 SH 27XBRD (SUTURE) ×1 IMPLANT
SYR 10ML LL (SYRINGE) ×1 IMPLANT
SYR CONTROL 10ML LL (SYRINGE) ×1 IMPLANT
TRAP FLUID SMOKE EVACUATOR (MISCELLANEOUS) ×1 IMPLANT

## 2022-03-09 NOTE — Brief Op Note (Signed)
03/09/2022  9:32 AM  PATIENT:  Melinda Mendoza  37 y.o. female  PRE-OPERATIVE DIAGNOSIS:  menorrhagia  POST-OPERATIVE DIAGNOSIS:  menorrhagia  PROCEDURE:  Procedure(s): HYSTERECTOMY VAGINAL WITH BILATERAL SALPINGECTOMY (Bilateral) TVH with bilateral salpingectomy  SURGEON:  Surgeon(s) and Role:    * Derrika Ruffalo, Gwen Her, MD - Primary    * Benjaman Kindler, MD - Assisting  PHYSICIAN ASSISTANT: PA student Laurence Ferrari  ASSISTANTS: none   ANESTHESIA:   general  EBL:  100 mL IOF 900 cc uo 200  BLOOD ADMINISTERED:none  DRAINS: none   LOCAL MEDICATIONS USED:  LIDOCAINE   SPECIMEN:  Source of Specimen:  cervix , uterus and bilateral fallopian tube s  DISPOSITION OF SPECIMEN:  PATHOLOGY  COUNTS:  YES  TOURNIQUET:  * No tourniquets in log *  DICTATION: .Other Dictation: Dictation Number verbal  PLAN OF CARE: Discharge to home after PACU  PATIENT DISPOSITION:  PACU - hemodynamically stable.   Delay start of Pharmacological VTE agent (>24hrs) due to surgical blood loss or risk of bleeding: not applicable

## 2022-03-09 NOTE — Discharge Instructions (Addendum)
AMBULATORY SURGERY  ?DISCHARGE INSTRUCTIONS ? ? ?The drugs that you were given will stay in your system until tomorrow so for the next 24 hours you should not: ? ?Drive an automobile ?Make any legal decisions ?Drink any alcoholic beverage ? ? ?You may resume regular meals tomorrow.  Today it is better to start with liquids and gradually work up to solid foods. ? ?You may eat anything you prefer, but it is better to start with liquids, then soup and crackers, and gradually work up to solid foods. ? ? ?Please notify your doctor immediately if you have any unusual bleeding, trouble breathing, redness and pain at the surgery site, drainage, fever, or pain not relieved by medication. ? ? ? ?Additional Instructions: ? ? ? ?Please contact your physician with any problems or Same Day Surgery at [phone], Monday through Friday 6 am to 4 pm, or [facility] Main number at [phone].  ?

## 2022-03-09 NOTE — Transfer of Care (Signed)
Immediate Anesthesia Transfer of Care Note  Patient: Gracianna Vink  Procedure(s) Performed: HYSTERECTOMY VAGINAL WITH BILATERAL SALPINGECTOMY (Bilateral: Vagina )  Patient Location: PACU  Anesthesia Type:General  Level of Consciousness: awake, alert , and oriented  Airway & Oxygen Therapy: Patient Spontanous Breathing and Patient connected to face mask oxygen  Post-op Assessment: Report given to RN and Post -op Vital signs reviewed and stable  Post vital signs: Reviewed and stable  Last Vitals:  Vitals Value Taken Time  BP 133/99   Temp    Pulse 60   Resp 16 03/09/22 0946  SpO2 95   Vitals shown include unvalidated device data.  Last Pain:  Vitals:   03/09/22 0644  TempSrc: Oral  PainSc: 0-No pain         Complications: No notable events documented.

## 2022-03-09 NOTE — Progress Notes (Signed)
Pt here for [facility] and BS  for menorrhagia . NPO . Neg HCG . Labs reviewed . All questions answered . Proceed .

## 2022-03-09 NOTE — Anesthesia Procedure Notes (Addendum)
Procedure Name: Intubation Date/Time: 03/09/2022 7:36 AM  Performed by: Lily Peer, Jerrel Tiberio, CRNAPre-anesthesia Checklist: Patient identified, Emergency Drugs available, Suction available and Patient being monitored Patient Re-evaluated:Patient Re-evaluated prior to induction Oxygen Delivery Method: Circle system utilized Preoxygenation: Pre-oxygenation with 100% oxygen Induction Type: IV induction Ventilation: Mask ventilation without difficulty Laryngoscope Size: McGraph and 3 Grade View: Grade I Tube type: Oral Tube size: 7.0 mm Number of attempts: 1 Airway Equipment and Method: Stylet Placement Confirmation: ETT inserted through vocal cords under direct vision, positive ETCO2 and breath sounds checked- equal and bilateral Secured at: 21 cm Tube secured with: Tape Dental Injury: Teeth and Oropharynx as per pre-operative assessment

## 2022-03-09 NOTE — Anesthesia Preprocedure Evaluation (Signed)
Anesthesia Evaluation  Patient identified by MRN, date of birth, ID band Patient awake    Reviewed: Allergy & Precautions, NPO status , Patient's Chart, lab work & pertinent test results  History of Anesthesia Complications (+) PONV and history of anesthetic complications  Airway Mallampati: II  TM Distance: <3 FB Neck ROM: full    Dental  (+) Chipped   Pulmonary asthma , sleep apnea , former smoker   Pulmonary exam normal        Cardiovascular Exercise Tolerance: Good hypertension, (-) angina (-) Past MI Normal cardiovascular exam     Neuro/Psych  Headaches PSYCHIATRIC DISORDERS       Neuromuscular disease    GI/Hepatic Neg liver ROS,GERD  Controlled,,  Endo/Other  negative endocrine ROS    Renal/GU      Musculoskeletal   Abdominal   Peds  Hematology negative hematology ROS (+)   Anesthesia Other Findings Past Medical History: No date: Anxiety No date: Asthma     Comment:  d/t allergies No date: Bipolar 1 disorder (HCC) No date: Chronic pain No date: Depression 2018: Family history of adverse reaction to anesthesia     Comment:  mother couldn't wake up from surgery. put into medical               coma and week later, DIED. No date: GERD (gastroesophageal reflux disease)     Comment:  uses TUMS 2021: Hypertension during pregnancy     Comment:  2015 and 2021 No date: Irregular heartbeat     Comment:  due to anxiety No date: Migraine     Comment:  uses ibuprofen No date: Morbid obesity with BMI of 40.0-44.9, adult ([place]) No date: PONV (postoperative nausea and vomiting) No date: Sleep apnea     Comment:  mild, no cpap No date: Vitamin B12 deficiency No date: Vitamin D deficiency  Past Surgical History: 2011: CHOLECYSTECTOMY 01/22/2020: LAPAROSCOPIC TUBAL LIGATION; Bilateral     Comment:  Procedure: LAPAROSCOPIC TUBAL LIGATION WITH FALOPE               RINGS;  Surgeon: Schermerhorn, Gwen Her, MD;   Location:               [facility] ORS;  Service: Gynecology;  Laterality: Bilateral; 2014: laproscopy for IUD removal No date: MOUTH SURGERY     Comment:  after [facility], needed another tooth removed d/t               infection No date: WISDOM TOOTH EXTRACTION  BMI    Body Mass Index: 39.27 kg/m      Reproductive/Obstetrics negative OB ROS                             Anesthesia Physical Anesthesia Plan  ASA: 3  Anesthesia Plan: General ETT   Post-op Pain Management:    Induction: Intravenous  PONV Risk Score and Plan: Ondansetron, Dexamethasone, Midazolam, Treatment may vary due to age or medical condition, Propofol infusion and TIVA  Airway Management Planned: Oral ETT  Additional Equipment:   Intra-op Plan:   Post-operative Plan: Extubation in OR  Informed Consent: I have reviewed the patients History and Physical, chart, labs and discussed the procedure including the risks, benefits and alternatives for the proposed anesthesia with the patient or authorized representative who has indicated his/her understanding and acceptance.     Dental Advisory Given  Plan Discussed with: Anesthesiologist, CRNA and Surgeon  Anesthesia Plan  Comments: (Patient consented for risks of anesthesia including but not limited to:  - adverse reactions to medications - damage to eyes, teeth, lips or other oral mucosa - nerve damage due to positioning  - sore throat or hoarseness - Damage to heart, brain, nerves, lungs, other parts of body or loss of life  Patient voiced understanding.)       Anesthesia Quick Evaluation

## 2022-03-09 NOTE — Op Note (Signed)
NAMESILVIA, HIGHTOWER MEDICAL RECORD NO: [id number] ACCOUNT NO: [id number] DATE OF BIRTH: 10-08-1985 FACILITY: [facility] LOCATION: [facility] PHYSICIAN: Boykin Nearing, MD  Operative Report   DATE OF PROCEDURE: 03/09/2022  PREOPERATIVE DIAGNOSIS:  Menorrhagia, unresponsive to conservative therapy.  POSTOPERATIVE DIAGNOSIS:  Menorrhagia, unresponsive to conservative therapy.  PROCEDURE:   1.  Total vaginal hysterectomy. 2.  Bilateral salpingectomy.  SURGEON:  Boykin Nearing, MD  FIRST ASSISTANT:  Benjaman Kindler, MD  SECOND ASSISTANT:  PA student, Laurence Ferrari.  ANESTHESIA:  General endotracheal anesthesia.  INDICATIONS:  A 37 year old gravida 3, para 3, patient with a long history of menorrhagia, who has failed conservative therapy including Lysteda.  The patient is unable to take birth control pills and desires permanent definitive surgical intervention.  DESCRIPTION OF PROCEDURE:  After adequate general endotracheal anesthesia, the patient was placed in dorsal supine position with the patient's legs were placed in the candy cane stirrups.  The patient's lower abdomen, perineum and vagina were prepped and  draped in normal sterile fashion.  The patient was prepped and draped in sterile fashion.  She did receive 2 grams of IV Ancef prior to commencement of the case for surgical prophylaxis.  Timeout was performed.  A weighted speculum was placed in the  posterior vaginal vault.  The bladder was drained, yielding 150 mL clear urine.  Cervix was grasped with 2 thyroid tenacula.  Cervix was then circumferentially injected with 1% lidocaine with 1:100,000 epinephrine.  A direct posterior colpotomy incision  was made.  Upon entry in the posterior cul-de-sac, the long weighted billed speculum was placed.  Uterosacral ligaments were then bilaterally clamped, transected and suture ligated and held for later identification.  The anterior cervix was incised with  the Bovie and the anterior  cul-de-sac was entered without difficulty and the Deaver retractor was placed within and the bladder was reflected anteriorly.  Cardinal ligaments were bilaterally clamped, transected and suture ligated with 0 Vicryl suture  followed by clamping of the uterine arteries, transection and suture ligating, sequential clamping and cutting and suturing ensued to the cornua, which were bilaterally clamped, transected doubly ligated with 0 Vicryl suture and the cervix and uterus  were delivered.  Each fallopian tube was grasped with a Babcock clamp and a distal two-thirds of each fallopian tube was clamped, transected and the pedicle was ligated with 0 Vicryl suture.  There was some bleeding from the left cornual pedicle, which  ultimately was secured with 3 separate figure-of-eight sutures.  Good hemostasis noted.  The preperitoneal fat posteriorly was oozing and required three separate figure-of-eight 2-0 Vicryl sutures.  The bladder was drained, yet again, yielding another 50  mL clear urine.  The vaginal cuff was then closed with a running 0 Vicryl suture and the uterosacral ligaments were plicated centrally and the rest of the cuff was closed.  Arista was used at the preperitoneal fat posteriorly.  Given there was a 2 cm  area between the peritoneum and the vaginal epithelium to ensure good hemostasis.  The bladder was ultimately drained yet again at the end of the procedure, yielding additional 10 mL of urine.  There were no complications.  ESTIMATED BLOOD LOSS:  100 mL.  INTRAOPERATIVE FLUIDS:  900 mL.  URINE OUTPUT:  200 mL.  The patient tolerated the procedure well and was taken to recovery room in good condition.   PUS D: 03/09/2022 10:06:27 am T: 03/09/2022 10:30:00 am  JOB: [id number]/ [id number]

## 2022-03-09 NOTE — Anesthesia Postprocedure Evaluation (Signed)
Anesthesia Post Note  Patient: Melinda Mendoza  Procedure(s) Performed: HYSTERECTOMY VAGINAL WITH BILATERAL SALPINGECTOMY (Bilateral: Vagina )  Patient location during evaluation: PACU Anesthesia Type: General Level of consciousness: awake and awake and alert Pain management: pain level controlled Vital Signs Assessment: post-procedure vital signs reviewed and stable Respiratory status: spontaneous breathing and respiratory function stable Cardiovascular status: stable Anesthetic complications: no  No notable events documented.   Last Vitals:  Vitals:   03/09/22 1015 03/09/22 1030  BP: 139/81   Pulse: 84   Resp: 14   Temp: (!) 35.9 C (!) 36.2 C  SpO2: 93%     Last Pain:  Vitals:   03/09/22 1030  TempSrc:   PainSc: 3                  VAN STAVEREN,Demontrae Gilbert

## 2022-03-10 ENCOUNTER — Encounter: Payer: Self-pay | Admitting: Obstetrics and Gynecology

## 2022-03-12 LAB — SURGICAL PATHOLOGY

## 2022-08-10 ENCOUNTER — Ambulatory Visit
Admission: RE | Admit: 2022-08-10 | Discharge: 2022-08-10 | Disposition: A | Discharge status: Home / Self Care | Payer: Medicaid Other | Source: Ambulatory Visit | Attending: Oncology | Admitting: Oncology

## 2022-08-10 DIAGNOSIS — Z9189 Other specified personal risk factors, not elsewhere classified: Secondary | ICD-10-CM | POA: Insufficient documentation

## 2022-08-13 ENCOUNTER — Encounter: Payer: Self-pay | Admitting: Oncology

## 2022-08-13 ENCOUNTER — Telehealth: Payer: Self-pay

## 2022-08-13 NOTE — Telephone Encounter (Signed)
I saw result notes on MRI and follow up. Pt asking about a medication she is suppose to start?

## 2022-08-13 NOTE — Telephone Encounter (Signed)
-----   Message from Rickard Patience, MD sent at 08/12/2022  9:53 PM EDT ----- Please arrange her to have bilateral MRI breast w wo contrast in 6 months - at risk of breast cancer.  She needs a follow up appt 1 week after her MRI thank you   zy

## 2022-08-13 NOTE — Telephone Encounter (Signed)
Communicated with pt via mychart and she is aware of plan   Please schedule and inform pt of appts:   MD only -next available- for medication discussion  MRI breast in 6 months  MD 1 week after MRI

## 2022-08-14 NOTE — Telephone Encounter (Signed)
Melinda Mendoza will you go ahead and set up all appts and inform pt via ph or myhcart please. Thanks

## 2022-08-20 ENCOUNTER — Inpatient Hospital Stay: Payer: Medicaid Other | Attending: Oncology | Admitting: Oncology

## 2022-08-20 ENCOUNTER — Inpatient Hospital Stay: Payer: Medicaid Other | Admitting: Oncology

## 2022-08-20 ENCOUNTER — Encounter: Payer: Self-pay | Admitting: Oncology

## 2022-08-20 ENCOUNTER — Other Ambulatory Visit: Payer: Self-pay | Admitting: Oncology

## 2022-08-20 DIAGNOSIS — Z803 Family history of malignant neoplasm of breast: Secondary | ICD-10-CM

## 2022-08-20 DIAGNOSIS — Z1501 Genetic susceptibility to malignant neoplasm of breast: Secondary | ICD-10-CM | POA: Insufficient documentation

## 2022-08-20 DIAGNOSIS — Z9189 Other specified personal risk factors, not elsewhere classified: Secondary | ICD-10-CM | POA: Diagnosis not present

## 2022-08-20 DIAGNOSIS — Z87891 Personal history of nicotine dependence: Secondary | ICD-10-CM | POA: Diagnosis not present

## 2022-08-20 NOTE — Telephone Encounter (Signed)
Can you change this morning appt to Mychart visit today at 3:30 please, per pt request

## 2022-08-20 NOTE — Progress Notes (Signed)
HEMATOLOGY-ONCOLOGY TeleHEALTH VISIT PROGRESS NOTE  I connected with Melinda Mendoza on 08/20/22  at  11:30 AM EDT by video enabled telemedicine visit and verified that I am speaking with the correct person using two identifiers. I discussed the limitations, risks, security and privacy concerns of performing an evaluation and management service by telemedicine and the availability of in-person appointments. The patient expressed understanding and agreed to proceed.   Other persons participating in the visit and their role in the encounter:  None  Patient's location: Home  Provider's location: office Chief Complaint: At risk of developing breast cancer.   INTERVAL HISTORY Melinda Mendoza is a 37 y.o. female who has above history reviewed by me today presents for follow up visit for management of At risk of developing breast cancer. Problems and complaints are listed below: Patient has no new complaints.  She has had mammogram done recently.  S/p hysterectomy in the near future.  Review of Systems  Constitutional:  Negative for appetite change, chills, fatigue and fever.  HENT:   Negative for hearing loss and voice change.   Eyes:  Negative for eye problems.  Respiratory:  Negative for chest tightness and cough.   Cardiovascular:  Negative for chest pain.  Gastrointestinal:  Negative for abdominal distention, abdominal pain and blood in stool.  Endocrine: Negative for hot flashes.  Genitourinary:  Positive for menstrual problem. Negative for difficulty urinating and frequency.   Musculoskeletal:  Negative for arthralgias.  Skin:  Negative for itching and rash.  Neurological:  Negative for extremity weakness.  Hematological:  Negative for adenopathy.  Psychiatric/Behavioral:  Negative for confusion.     Past Medical History:  Diagnosis Date   Anxiety    Asthma    d/t allergies   Bipolar 1 disorder (HCC)    Chronic pain    Depression    Family history of adverse reaction to anesthesia  2018   mother couldn't wake up from surgery. put into medical coma and week later, DIED.   GERD (gastroesophageal reflux disease)    uses TUMS   Hypertension during pregnancy 2021   2015 and 2021   Irregular heartbeat    due to anxiety   Migraine    uses ibuprofen   Morbid obesity with BMI of 40.0-44.9, adult (HCC)    PONV (postoperative nausea and vomiting)    Sleep apnea    mild, no cpap   Vitamin B12 deficiency    Vitamin D deficiency    Past Surgical History:  Procedure Laterality Date   CHOLECYSTECTOMY  2011   LAPAROSCOPIC TUBAL LIGATION Bilateral 01/22/2020   Procedure: LAPAROSCOPIC TUBAL LIGATION WITH FALOPE RINGS;  Surgeon: Schermerhorn, Ihor Austin, MD;  Location: [facility];  Service: Gynecology;  Laterality: Bilateral;   laproscopy for IUD removal  2014   MOUTH SURGERY     after [facility], needed another tooth removed d/t infection   VAGINAL HYSTERECTOMY Bilateral 03/09/2022   Procedure: HYSTERECTOMY VAGINAL WITH BILATERAL SALPINGECTOMY;  Surgeon: Schermerhorn, Ihor Austin, MD;  Location: [facility];  Service: Gynecology;  Laterality: Bilateral;   WISDOM TOOTH EXTRACTION      Family History  Problem Relation Age of Onset   Heart disease Mother    Diabetes Mother    Breast cancer Mother 75   Kidney failure Father    Gestational diabetes Sister    Heart disease Maternal Grandmother    Diverticulitis Maternal Grandmother    Breast cancer Maternal Grandmother        dx 89s, dx 3 more  times    Social History   Socioeconomic History   Marital status: Single    Spouse name: Feliz Beam, father of baby   Number of children: 2   Years of education: Not on file   Highest education level: Some college, no degree  Occupational History   Occupation: Herbalist.requires lifting    Employer: [employer]    Comment: maternity  leave  Tobacco Use   Smoking status: Former    Packs/day: 0.50    Years: 13.00    Additional pack years: 0.00    Total pack years: 6.50     Types: Cigarettes    Quit date: 10/19/2011    Years since quitting: 10.8   Smokeless tobacco: Never  Vaping Use   Vaping Use: Never used  Substance and Sexual Activity   Alcohol use: Not Currently    Comment: rarely- once per year   Drug use: No   Sexual activity: Not Currently    Birth control/protection: Other-see comments    Comment: BTL  Other Topics Concern   Not on file  Social History Narrative   Right handed single female. Has one daughter and one son. lives with the childs father in a one story house. There are 5 steps to enter the home. Patient occasionally drinks tea and soda, no coffee. She does not exercise, but states she is very active with her young daughter.    Social Determinants of Health   Financial Resource Strain: Not on file  Food Insecurity: Not on file  Transportation Needs: Not on file  Physical Activity: Not on file  Stress: Not on file  Social Connections: Not on file  Intimate Partner Violence: Not on file    Current Outpatient Medications on File Prior to Visit  Medication Sig Dispense Refill   acetaminophen (TYLENOL) 500 MG tablet Take 500 mg by mouth every 6 (six) hours as needed.     albuterol (VENTOLIN HFA) 108 (90 Base) MCG/ACT inhaler Inhale 1-2 puffs into the lungs every 6 (six) hours as needed for wheezing or shortness of breath.     busPIRone (BUSPAR) 15 MG tablet Take 15 mg by mouth daily as needed.     cetirizine (ZYRTEC) 10 MG tablet Take 10 mg by mouth daily.     Cholecalciferol (VITAMIN D3) 125 MCG (5000 UT) CAPS Take 1 capsule by mouth daily.     cyanocobalamin 1000 MCG tablet Take 1,000 mcg by mouth daily.     Digestive Enzymes (SUPER ENZYMES PO) Take 1 capsule by mouth daily.     escitalopram (LEXAPRO) 20 MG tablet Take 20 mg by mouth daily.     ibuprofen (ADVIL) 600 MG tablet Take 1 tablet (600 mg total) by mouth every 6 (six) hours as needed. 30 tablet 0   MAGNESIUM OXIDE 400 PO Take 1 tablet by mouth daily.     methocarbamol  (ROBAXIN) 500 MG tablet Take 500 mg by mouth 3 (three) times daily.     montelukast (SINGULAIR) 10 MG tablet Take 10 mg by mouth at bedtime.     OVER THE COUNTER MEDICATION Place 3 drops under the tongue daily. B-complex liquid     Probiotic Product (PROBIOTIC-10 PO) Take 1 capsule by mouth daily.     topiramate (TOPAMAX) 50 MG tablet Take 50 mg by mouth 2 (two) times daily.     [DISCONTINUED] clonazePAM (KLONOPIN) 0.5 MG tablet Take 1 tablet (0.5 mg total) by mouth daily. 7 tablet 0   [DISCONTINUED] lamoTRIgine (LAMICTAL) 100  MG tablet Take 100 mg by mouth 2 (two) times daily.     [DISCONTINUED] norethindrone-ethinyl estradiol (JUNEL FE,GILDESS FE,LOESTRIN FE) 1-20 MG-MCG tablet Take 1 tablet by mouth daily.     [DISCONTINUED] propranolol ER (INDERAL LA) 60 MG 24 hr capsule Take 60 mg by mouth daily.     [DISCONTINUED] SUMAtriptan (IMITREX) 100 MG tablet Take 1 tablet earliest onset of migraine.  May repeat once in 2 hours if headache persists or recurs.  Do not exceed 2 tablets in 24 hours. 10 tablet 2   No current facility-administered medications on file prior to visit.    Allergies  Allergen Reactions   Gabapentin Other (See Comments)    Weight gain   Tioconazole Other (See Comments)    burning       Observations/Objective: There were no vitals filed for this visit. There is no height or weight on file to calculate BMI.  Physical Exam Neurological:     Mental Status: She is alert.    Labs    Latest Ref Rng & Units 03/02/2022    2:30 PM 08/14/2021    9:26 AM 08/13/2021   10:37 AM  CBC  WBC 4.0 - 10.5 K/uL 8.5  6.1  6.8   Hemoglobin 12.0 - 15.0 g/dL 72.5  36.6  44.0   Hematocrit 36.0 - 46.0 % 37.1  38.5  36.9   Platelets 150 - 400 K/uL 242  228  235       Latest Ref Rng & Units 03/02/2022    2:30 PM 08/14/2021    9:26 AM 01/20/2020   10:47 AM  CMP  Glucose 70 - 99 mg/dL 93  347  74   BUN 6 - 20 mg/dL 11  10  10    Creatinine 0.44 - 1.00 mg/dL 4.25  9.56  3.87   Sodium 135  - 145 mmol/L 137  139  140   Potassium 3.5 - 5.1 mmol/L 3.5  3.5  3.9   Chloride 98 - 111 mmol/L 107  107  105   CO2 22 - 32 mmol/L 22  22  25    Calcium 8.9 - 10.3 mg/dL 8.8  8.8  9.2     ASSESSMENT & PLAN:   At high risk for breast cancer #At high risk for breast cancer Recommend patient to start with annual mammogram and annual MRI.  We will alternate every 6 months. Chemoprevention with tamoxifen was discussed.  Rationale and potential side effects were reviewed in details.  Patient is undecided and she will update me if she decides to proceed with this option.  Check Factor 5 leiden mutation, prothrombine gene mutation - family history of stroke.  Encourage monthly breast self-examination Recommend healthy diet and exercising. Mammogram results were reviewed and discussed with patient. Mammogram result was reviewed and discussed with patietn obtain MRI bilateral with and without contrast in 6 months.   Orders Placed This Encounter  Procedures   Prothrombin gene mutation    Standing Status:   Future    Standing Expiration Date:   08/20/2023   Factor 5 leiden    Standing Status:   Future    Standing Expiration Date:   08/20/2023   Follow up in 6 months.  I discussed the assessment and treatment plan with the patient. The patient was provided an opportunity to ask questions and all were answered. The patient agreed with the plan and demonstrated an understanding of the instructions.  The patient was advised to call back or seek  an in-person evaluation if the symptoms worsen or if the  Rickard Patience, MD 08/20/2022 7:54 PM

## 2022-08-20 NOTE — Assessment & Plan Note (Addendum)
#  At high risk for breast cancer Recommend patient to start with annual mammogram and annual MRI.  We will alternate every 6 months. Chemoprevention with tamoxifen was discussed.  Rationale and potential side effects were reviewed in details.  Patient is undecided and she will update me if she decides to proceed with this option.  Check Factor 5 leiden mutation, prothrombine gene mutation - family history of stroke.  Encourage monthly breast self-examination Recommend healthy diet and exercising. Mammogram results were reviewed and discussed with patient. Mammogram result was reviewed and discussed with patietn obtain MRI bilateral with and without contrast in 6 months.

## 2022-08-20 NOTE — Assessment & Plan Note (Deleted)
#  At high risk for breast cancer Recommend patient to start with annual mammogram and annual MRI.  We will alternate every 6 months. Chemoprevention with tamoxifen was discussed.  Rationale and potential side effects were reviewed in details.  Patient is undecided and she will update me if she decides to proceed with this option. Encourage monthly breast self-examination Recommend healthy diet and exercising. Mammogram results were reviewed and discussed with patient.  Obtain mammogram in 6 months. Also will obtain MRI bilateral with and without contrast due to her high Tyrer-Cuzick risk.

## 2022-08-27 ENCOUNTER — Inpatient Hospital Stay: Payer: Medicaid Other

## 2023-02-14 ENCOUNTER — Other Ambulatory Visit: Payer: Self-pay | Admitting: Oncology

## 2023-02-14 ENCOUNTER — Ambulatory Visit
Admission: RE | Admit: 2023-02-14 | Discharge: 2023-02-14 | Disposition: A | Discharge status: Home / Self Care | Payer: Medicaid Other | Source: Ambulatory Visit | Attending: Oncology | Admitting: Oncology

## 2023-02-14 DIAGNOSIS — Z9189 Other specified personal risk factors, not elsewhere classified: Secondary | ICD-10-CM | POA: Insufficient documentation

## 2023-02-14 DIAGNOSIS — R928 Other abnormal and inconclusive findings on diagnostic imaging of breast: Secondary | ICD-10-CM

## 2023-02-14 DIAGNOSIS — N63 Unspecified lump in unspecified breast: Secondary | ICD-10-CM

## 2023-02-14 MED ORDER — GADOBUTROL 1 MMOL/ML IV SOLN
10.0000 mL | Freq: Once | INTRAVENOUS | Status: AC | PRN
  Administered 2023-02-14: 10 mL via INTRAVENOUS

## 2023-02-20 ENCOUNTER — Ambulatory Visit
Admission: RE | Admit: 2023-02-20 | Discharge: 2023-02-20 | Disposition: A | Discharge status: Home / Self Care | Payer: Medicaid Other | Source: Ambulatory Visit | Attending: Oncology | Admitting: Oncology

## 2023-02-20 ENCOUNTER — Telehealth: Payer: Self-pay

## 2023-02-20 ENCOUNTER — Other Ambulatory Visit: Payer: Self-pay

## 2023-02-20 DIAGNOSIS — R928 Other abnormal and inconclusive findings on diagnostic imaging of breast: Secondary | ICD-10-CM | POA: Diagnosis present

## 2023-02-20 DIAGNOSIS — N63 Unspecified lump in unspecified breast: Secondary | ICD-10-CM | POA: Diagnosis present

## 2023-02-20 DIAGNOSIS — N6310 Unspecified lump in the right breast, unspecified quadrant: Secondary | ICD-10-CM

## 2023-02-20 NOTE — Telephone Encounter (Signed)
 Pt had Breast biopsy today. Dr. Cathie Hoops would like appt r/s to next week for results. Please r/s and notify pt of new appt details.

## 2023-02-20 NOTE — Telephone Encounter (Signed)
 spoke to pt who states that an ultrasound was done today, but not the biopys because they couldnt see the mass. They told pt that MRI bx would need to be ordered by Dr. Cathie Hoops.   Will set up MRI breast bx and r/s appt once we know bx date. pt aware of plan

## 2023-02-21 ENCOUNTER — Telehealth: Payer: Medicaid Other | Admitting: Oncology

## 2023-02-21 ENCOUNTER — Inpatient Hospital Stay: Payer: Medicaid Other | Admitting: Oncology

## 2023-02-21 ENCOUNTER — Encounter: Payer: Self-pay | Admitting: Oncology

## 2023-02-21 NOTE — Telephone Encounter (Signed)
 Patient scheduled for MRI breast bx on 1/15, please schedule MD approx 1 week after bx. (in person or virtual ok). Please notify pt of appt details.

## 2023-02-27 ENCOUNTER — Ambulatory Visit
Admission: RE | Admit: 2023-02-27 | Discharge: 2023-02-27 | Disposition: A | Discharge status: Home / Self Care | Payer: Medicaid Other | Source: Ambulatory Visit | Attending: Oncology | Admitting: Oncology

## 2023-02-27 ENCOUNTER — Ambulatory Visit
Admission: RE | Admit: 2023-02-27 | Discharge: 2023-02-27 | Disposition: A | Discharge status: Home / Self Care | Payer: Medicaid Other | Source: Ambulatory Visit | Attending: Oncology

## 2023-02-27 DIAGNOSIS — N6310 Unspecified lump in the right breast, unspecified quadrant: Secondary | ICD-10-CM

## 2023-02-27 MED ORDER — GADOPICLENOL 0.5 MMOL/ML IV SOLN
10.0000 mL | Freq: Once | INTRAVENOUS | Status: AC | PRN
  Administered 2023-02-27: 10 mL via INTRAVENOUS

## 2023-02-28 LAB — SURGICAL PATHOLOGY

## 2023-03-06 ENCOUNTER — Inpatient Hospital Stay: Payer: Medicaid Other | Attending: Oncology | Admitting: Oncology

## 2023-03-06 ENCOUNTER — Encounter: Payer: Self-pay | Admitting: Oncology

## 2023-03-06 DIAGNOSIS — Z9189 Other specified personal risk factors, not elsewhere classified: Secondary | ICD-10-CM | POA: Diagnosis not present

## 2023-03-06 DIAGNOSIS — N6310 Unspecified lump in the right breast, unspecified quadrant: Secondary | ICD-10-CM | POA: Insufficient documentation

## 2023-03-06 DIAGNOSIS — Z803 Family history of malignant neoplasm of breast: Secondary | ICD-10-CM | POA: Insufficient documentation

## 2023-03-06 DIAGNOSIS — Z87891 Personal history of nicotine dependence: Secondary | ICD-10-CM | POA: Insufficient documentation

## 2023-03-06 NOTE — Assessment & Plan Note (Addendum)
#  At high risk for breast cancer Recommend patient to start with annual mammogram and annual MRI.  We will alternate every 6 months. Chemoprevention with tamoxifen was discussed.  Rationale and potential side effects were reviewed in details.  Patient is undecided and she will update me if she decides to proceed with this option.   Recent MRI breast results and biopsy results were reviewed with patient.  repeat MRI bilateral with and without contrast in 6 months.

## 2023-03-06 NOTE — Progress Notes (Signed)
HEMATOLOGY-ONCOLOGY TeleHEALTH VISIT PROGRESS NOTE  I connected with Melinda Mendoza on 03/06/23  at  2:45 PM EST by video enabled telemedicine visit and verified that I am speaking with the correct person using two identifiers. I discussed the limitations, risks, security and privacy concerns of performing an evaluation and management service by telemedicine and the availability of in-person appointments. The patient expressed understanding and agreed to proceed.   Other persons participating in the visit and their role in the encounter:  None  Patient's location: Home  Provider's location: office Chief Complaint: at risk of breast cancer.    INTERVAL HISTORY Melinda Mendoza is a 38 y.o. female who has above history reviewed by me today presents for follow up visit for management of at risk of breast cancer.  No new complaints.  02/14/2023 MRI breasts showed  1. Indeterminate 1.5 cm mass-like enhancement in the outer RIGHT breast at anterior to middle depth, near 9 o'clock location, without a correlate on the most recent prior mammogram. 2. No MRI evidence of malignancy involving the LEFT breast. 3. No pathologic lymphadenopathy.  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 No malignancy identified.    Review of Systems  Constitutional:  Negative for appetite change, chills, fatigue and fever.  HENT:   Negative for hearing loss and voice change.   Eyes:  Negative for eye problems.  Respiratory:  Negative for chest tightness and cough.   Cardiovascular:  Negative for chest pain.  Gastrointestinal:  Negative for abdominal distention, abdominal pain and blood in stool.  Endocrine: Negative for hot flashes.  Genitourinary:  Negative for difficulty urinating and frequency.   Musculoskeletal:  Negative for arthralgias.  Skin:  Negative for itching and rash.  Neurological:  Negative for extremity  weakness.  Hematological:  Negative for adenopathy.  Psychiatric/Behavioral:  Negative for confusion.     Past Medical History:  Diagnosis Date   Anxiety    Asthma    d/t allergies   Bipolar 1 disorder (HCC)    Chronic pain    Depression    Family history of adverse reaction to anesthesia 2018   mother couldn't wake up from surgery. put into medical coma and week later, DIED.   GERD (gastroesophageal reflux disease)    uses TUMS   Hypertension during pregnancy 2021   2015 and 2021   Irregular heartbeat    due to anxiety   Migraine    uses ibuprofen   Morbid obesity with BMI of 40.0-44.9, adult (HCC)    PONV (postoperative nausea and vomiting)    Sleep apnea    mild, no cpap   Vitamin B12 deficiency    Vitamin D deficiency    Past Surgical History:  Procedure Laterality Date   CHOLECYSTECTOMY  2011   LAPAROSCOPIC TUBAL LIGATION Bilateral 01/22/2020   Procedure: LAPAROSCOPIC TUBAL LIGATION WITH FALOPE RINGS;  Surgeon: Schermerhorn, Ihor Austin, MD;  Location: [facility];  Service: Gynecology;  Laterality: Bilateral;   laproscopy for IUD removal  2014   MOUTH SURGERY     after [facility], needed another tooth removed d/t infection   VAGINAL HYSTERECTOMY Bilateral 03/09/2022   Procedure: HYSTERECTOMY VAGINAL WITH BILATERAL SALPINGECTOMY;  Surgeon: Schermerhorn, Ihor Austin, MD;  Location: [facility];  Service: Gynecology;  Laterality: Bilateral;   WISDOM TOOTH EXTRACTION      Family History  Problem Relation Age of Onset   Heart disease Mother    Diabetes Mother    Breast  cancer Mother 41   Kidney failure Father    Gestational diabetes Sister    Heart disease Maternal Grandmother    Diverticulitis Maternal Grandmother    Breast cancer Maternal Grandmother        dx 33s, dx 3 more times    Social History   Socioeconomic History   Marital status: Single    Spouse name: Feliz Beam, father of baby   Number of children: 2   Years of education: Not on file   Highest education level:  Some college, no degree  Occupational History   Occupation: Herbalist.requires lifting    Employer: [employer]    Comment: maternity  leave  Tobacco Use   Smoking status: Former    Current packs/day: 0.00    Average packs/day: 0.5 packs/day for 13.0 years (6.5 ttl pk-yrs)    Types: Cigarettes    Start date: 10/19/1998    Quit date: 10/19/2011    Years since quitting: 11.3   Smokeless tobacco: Never  Vaping Use   Vaping status: Never Used  Substance and Sexual Activity   Alcohol use: Not Currently    Comment: rarely- once per year   Drug use: No   Sexual activity: Not Currently    Birth control/protection: Other-see comments    Comment: BTL  Other Topics Concern   Not on file  Social History Narrative   Right handed single female. Has one daughter and one son. lives with the childs father in a one story house. There are 5 steps to enter the home. Patient occasionally drinks tea and soda, no coffee. She does not exercise, but states she is very active with her young daughter.    Social Drivers of Health   Financial Resource Strain: Patient Declined (12/06/2022)   Received from [facility]   Overall Financial Resource Strain (CARDIA)    Difficulty of Paying Living Expenses: Patient declined  Food Insecurity: Patient Declined (12/06/2022)   Received from [facility]   Hunger Vital Sign    Worried About Running Out of Food in the Last Year: Patient declined    Ran Out of Food in the Last Year: Patient declined  Transportation Needs: Patient Declined (12/06/2022)   Received from [facility] - Transportation    In the past 12 months, has lack of transportation kept you from medical appointments or from getting medications?: Patient declined    Lack of Transportation (Non-Medical): Patient declined  Physical Activity: Not on file  Stress: Not on file  Social Connections: Not on file  Intimate Partner  Violence: Not on file    Current Outpatient Medications on File Prior to Visit  Medication Sig Dispense Refill   acetaminophen (TYLENOL) 500 MG tablet Take 500 mg by mouth every 6 (six) hours as needed.     albuterol (VENTOLIN HFA) 108 (90 Base) MCG/ACT inhaler Inhale 1-2 puffs into the lungs every 6 (six) hours as needed for wheezing or shortness of breath.     cetirizine (ZYRTEC) 10 MG tablet Take 10 mg by mouth daily.     Cholecalciferol (VITAMIN D3) 125 MCG (5000 UT) CAPS Take 1 capsule by mouth daily.     cyanocobalamin 1000 MCG tablet Take 1,000 mcg by mouth daily.     Digestive Enzymes (SUPER ENZYMES PO) Take 1 capsule by mouth daily.     escitalopram (LEXAPRO) 20 MG tablet Take 20 mg by mouth daily.     ibuprofen (ADVIL) 600 MG  tablet Take 1 tablet (600 mg total) by mouth every 6 (six) hours as needed. 30 tablet 0   MAGNESIUM OXIDE 400 PO Take 1 tablet by mouth daily.     methocarbamol (ROBAXIN) 500 MG tablet Take 500 mg by mouth 3 (three) times daily.     montelukast (SINGULAIR) 10 MG tablet Take 10 mg by mouth at bedtime.     OVER THE COUNTER MEDICATION Place 3 drops under the tongue daily. B-complex liquid     Probiotic Product (PROBIOTIC-10 PO) Take 1 capsule by mouth daily.     topiramate (TOPAMAX) 50 MG tablet Take 50 mg by mouth 2 (two) times daily.     [DISCONTINUED] clonazePAM (KLONOPIN) 0.5 MG tablet Take 1 tablet (0.5 mg total) by mouth daily. 7 tablet 0   [DISCONTINUED] lamoTRIgine (LAMICTAL) 100 MG tablet Take 100 mg by mouth 2 (two) times daily.     [DISCONTINUED] norethindrone-ethinyl estradiol (JUNEL FE,GILDESS FE,LOESTRIN FE) 1-20 MG-MCG tablet Take 1 tablet by mouth daily.     [DISCONTINUED] propranolol ER (INDERAL LA) 60 MG 24 hr capsule Take 60 mg by mouth daily.     [DISCONTINUED] SUMAtriptan (IMITREX) 100 MG tablet Take 1 tablet earliest onset of migraine.  May repeat once in 2 hours if headache persists or recurs.  Do not exceed 2 tablets in 24 hours. 10 tablet 2    No current facility-administered medications on file prior to visit.    Allergies  Allergen Reactions   Gabapentin Other (See Comments)    Weight gain   Tioconazole Other (See Comments)    burning       Observations/Objective: There were no vitals filed for this visit. There is no height or weight on file to calculate BMI.  Physical Exam Neurological:     Mental Status: She is alert.     CBC    Component Value Date/Time   WBC 8.5 03/02/2022 1430   RBC 4.46 03/02/2022 1430   HGB 12.4 03/02/2022 1430   HGB 11.1 (L) 10/04/2013 0100   HCT 37.1 03/02/2022 1430   HCT 32.3 (L) 10/04/2013 0100   PLT 242 03/02/2022 1430   PLT 165 10/04/2013 0100   MCV 83.2 03/02/2022 1430   MCV 89 10/04/2013 0100   MCH 27.8 03/02/2022 1430   MCHC 33.4 03/02/2022 1430   RDW 13.5 03/02/2022 1430   RDW 15.5 (H) 10/04/2013 0100   LYMPHSABS 2.0 08/13/2021 1037   LYMPHSABS 2.1 10/04/2013 0100   MONOABS 0.3 08/13/2021 1037   MONOABS 0.5 10/04/2013 0100   EOSABS 0.2 08/13/2021 1037   EOSABS 0.1 10/04/2013 0100   BASOSABS 0.1 08/13/2021 1037   BASOSABS 0.0 10/04/2013 0100    CMP     Component Value Date/Time   NA 137 03/02/2022 1430   NA 142 10/02/2013 1822   K 3.5 03/02/2022 1430   K 3.6 10/02/2013 1822   CL 107 03/02/2022 1430   CL 108 (H) 10/02/2013 1822   CO2 22 03/02/2022 1430   CO2 22 10/02/2013 1822   GLUCOSE 93 03/02/2022 1430   GLUCOSE 93 10/02/2013 1822   BUN 11 03/02/2022 1430   BUN 4 (L) 10/02/2013 1822   CREATININE 0.70 03/02/2022 1430   CREATININE 0.63 10/02/2013 1822   CALCIUM 8.8 (L) 03/02/2022 1430   CALCIUM 8.8 10/02/2013 1822   PROT 5.9 (L) 11/23/2019 0107   ALBUMIN 2.9 (L) 11/23/2019 0107   AST 18 11/23/2019 0107   AST 10 (L) 10/02/2013 1822   ALT 14 11/23/2019  0107   ALKPHOS 80 11/23/2019 0107   BILITOT 0.5 11/23/2019 0107   GFRNONAA >60 03/02/2022 1430   GFRNONAA >60 10/02/2013 1822   GFRAA >60 11/15/2019 2356   GFRAA >60 10/02/2013 1822     ASSESSMENT & PLAN:   At high risk for breast cancer #At high risk for breast cancer Recommend patient to start with annual mammogram and annual MRI.  We will alternate every 6 months. Chemoprevention with tamoxifen was discussed.  Rationale and potential side effects were reviewed in details.  Patient is undecided and she will update me if she decides to proceed with this option.   Recent MRI breast results and biopsy results were reviewed with patient.  repeat MRI bilateral with and without contrast in 6 months.   Orders Placed This Encounter  Procedures   MR BREAST BILATERAL W WO CONTRAST INC CAD    Standing Status:   Future    Expected Date:   09/03/2023    Expiration Date:   03/05/2024    If indicated for the ordered procedure, I authorize the administration of contrast media per Radiology protocol:   Yes    What is the patient's sedation requirement?:   No Sedation    Does the patient have a pacemaker or implanted devices?:   No    Radiology Contrast Protocol - do NOT remove file path:   [email or web address]    Preferred imaging location?:   [facility] (table limit - 550lbs)   Follow up in 6 months.  All questions were answered. The patient knows to call the clinic with any problems, questions or concerns.   I discussed the assessment and treatment plan with the patient. The patient was provided an opportunity to ask questions and all were answered. The patient agreed with the plan and demonstrated an understanding of the instructions.  The patient was advised to call back or seek an in-person evaluation if the symptoms worsen or if the condition fails to improve as anticipated.    Rickard Patience, MD 03/06/2023 6:11 PM

## 2023-10-16 ENCOUNTER — Other Ambulatory Visit: Payer: Self-pay | Admitting: Oncology

## 2023-10-16 DIAGNOSIS — N6489 Other specified disorders of breast: Secondary | ICD-10-CM

## 2023-10-24 ENCOUNTER — Other Ambulatory Visit

## 2023-10-24 ENCOUNTER — Inpatient Hospital Stay: Admission: RE | Admit: 2023-10-24 | Source: Ambulatory Visit

## 2023-10-31 ENCOUNTER — Ambulatory Visit
Admission: RE | Admit: 2023-10-31 | Discharge: 2023-10-31 | Disposition: A | Discharge status: Home / Self Care | Source: Ambulatory Visit | Attending: Oncology | Admitting: Oncology

## 2023-10-31 DIAGNOSIS — N6489 Other specified disorders of breast: Secondary | ICD-10-CM | POA: Diagnosis present

## 2023-11-01 ENCOUNTER — Ambulatory Visit: Payer: Self-pay | Admitting: Oncology

## 2023-11-01 NOTE — Telephone Encounter (Signed)
-----   Message from Zelphia Cap sent at 11/01/2023 11:09 AM EDT ----- Normal mammogram. Please arrange her to get bilateral breast MRI w wo contrast in 6 months prior to MD thanks.  ----- Message ----- From: Interface, Rad Results In Sent: 10/31/2023   5:21 PM EDT To: Zelphia Cap, MD

## 2023-11-01 NOTE — Telephone Encounter (Signed)
 Mychart message sent to pt with results and follow up plan.   Please schedule:   MRI breast in 6 months/ MD a few days after MRI breast.   Please notify pt of appts

## 2023-11-13 ENCOUNTER — Other Ambulatory Visit: Payer: Self-pay | Admitting: Family Medicine

## 2023-11-13 DIAGNOSIS — Z9049 Acquired absence of other specified parts of digestive tract: Secondary | ICD-10-CM

## 2023-11-13 DIAGNOSIS — R5383 Other fatigue: Secondary | ICD-10-CM

## 2023-11-13 DIAGNOSIS — R1011 Right upper quadrant pain: Secondary | ICD-10-CM

## 2023-11-14 ENCOUNTER — Ambulatory Visit
Admission: RE | Admit: 2023-11-14 | Discharge: 2023-11-14 | Disposition: A | Discharge status: Home / Self Care | Source: Ambulatory Visit | Attending: Family Medicine | Admitting: Family Medicine

## 2023-11-14 DIAGNOSIS — Z9049 Acquired absence of other specified parts of digestive tract: Secondary | ICD-10-CM | POA: Insufficient documentation

## 2023-11-14 DIAGNOSIS — R1011 Right upper quadrant pain: Secondary | ICD-10-CM | POA: Diagnosis present

## 2023-11-14 DIAGNOSIS — R5383 Other fatigue: Secondary | ICD-10-CM | POA: Insufficient documentation

## 2023-12-09 ENCOUNTER — Ambulatory Visit
Admission: EM | Admit: 2023-12-09 | Discharge: 2023-12-09 | Disposition: A | Discharge status: Home / Self Care | Attending: Emergency Medicine | Admitting: Emergency Medicine

## 2023-12-09 ENCOUNTER — Encounter: Payer: Self-pay | Admitting: Emergency Medicine

## 2023-12-09 DIAGNOSIS — N390 Urinary tract infection, site not specified: Secondary | ICD-10-CM | POA: Diagnosis not present

## 2023-12-09 LAB — URINALYSIS, W/ REFLEX TO CULTURE (INFECTION SUSPECTED)
Bilirubin Urine: NEGATIVE
Glucose, UA: 100 mg/dL — AB
Ketones, ur: NEGATIVE mg/dL
Nitrite: POSITIVE — AB
Protein, ur: 30 mg/dL — AB
Specific Gravity, Urine: 1.01 (ref 1.005–1.030)
pH: 5.5 (ref 5.0–8.0)

## 2023-12-09 MED ORDER — NITROFURANTOIN MONOHYD MACRO 100 MG PO CAPS: 100.0000 mg | ORAL_CAPSULE | Freq: Two times a day (BID) | ORAL | 0 refills | Status: AC

## 2023-12-09 MED ORDER — PHENAZOPYRIDINE HCL 200 MG PO TABS: 200.0000 mg | ORAL_TABLET | Freq: Three times a day (TID) | ORAL | 0 refills | Status: AC

## 2023-12-09 NOTE — ED Triage Notes (Signed)
 Pt c/o urinary freq,burning & pain x2 days. Denies any hematuria. Has tried AZO w/o relief.

## 2023-12-09 NOTE — ED Provider Notes (Signed)
 [facility]    CSN: [id number] Arrival date & time: 12/09/23  1155      History   Chief Complaint Chief Complaint  Patient presents with   Dysuria    HPI Melinda Mendoza is a 38 y.o. female.   HPI  38 year old female with past medical history significant for bipolar 2 disorder, IDA, depression, anxiety, asthma, OSA, migraine headaches, and vitamin D deficiency presents for evaluation of UTI symptoms that started 2 days ago and include dysuria, itching at the urethra, urgency, and frequency.  She denies any hematuria.  No fever, low back pain, abdominal pain, or vaginal discharge.  She has had some mild nausea.  Past Medical History:  Diagnosis Date   Anxiety    Asthma    d/t allergies   Bipolar 1 disorder (HCC)    Chronic pain    Depression    Family history of adverse reaction to anesthesia 2018   mother couldn't wake up from surgery. put into medical coma and week later, DIED.   GERD (gastroesophageal reflux disease)    uses TUMS   Hypertension during pregnancy 2021   2015 and 2021   Irregular heartbeat    due to anxiety   Migraine    uses ibuprofen    Morbid obesity with BMI of 40.0-44.9, adult (HCC)    PONV (postoperative nausea and vomiting)    Sleep apnea    mild, no cpap   Vitamin B12 deficiency    Vitamin D deficiency     Patient Active Problem List   Diagnosis Date Noted   At high risk for breast cancer 02/14/2022   Family history of cancer 02/14/2022   Genetic testing 06/22/2021   Iron  deficiency anemia 12/03/2019   Chronic hypertension 11/25/2019   NSVD (normal spontaneous vaginal delivery) 11/25/2019   Medication exposure during first trimester of pregnancy    Supervision of other high risk pregnancies, third trimester 05/13/2019   High-tone pelvic floor dysfunction 08/27/2017   Urinary incontinence, mixed 08/27/2017   Adjustment disorder with anxiety 11/15/2016   Reactive depression 11/15/2016   Anterior dislocation of right  shoulder 11/02/2015   Bipolar affective disorder, currently depressed, moderate (HCC) 06/07/2015   Morbid obesity due to excess calories (HCC) 03/04/2015   Bipolar 2 disorder (HCC) 08/20/2013    Past Surgical History:  Procedure Laterality Date   CHOLECYSTECTOMY  2011   LAPAROSCOPIC TUBAL LIGATION Bilateral 01/22/2020   Procedure: LAPAROSCOPIC TUBAL LIGATION WITH FALOPE RINGS;  Surgeon: Lovetta Debby PARAS, MD;  Location: [facility];  Service: Gynecology;  Laterality: Bilateral;   laproscopy for IUD removal  2014   MOUTH SURGERY     after [facility], needed another tooth removed d/t infection   VAGINAL HYSTERECTOMY Bilateral 03/09/2022   Procedure: HYSTERECTOMY VAGINAL WITH BILATERAL SALPINGECTOMY;  Surgeon: Schermerhorn, Debby PARAS, MD;  Location: [facility];  Service: Gynecology;  Laterality: Bilateral;   WISDOM TOOTH EXTRACTION      OB History     Gravida  3   Para  3   Term  3   Preterm  0   AB  0   Living  3      SAB  0   IAB  0   Ectopic  0   Multiple  0   Live Births  3            Home Medications    Prior to Admission medications   Medication Sig Start Date End Date Taking? Authorizing Provider  nitrofurantoin, macrocrystal-monohydrate, (MACROBID)  100 MG capsule Take 1 capsule (100 mg total) by mouth 2 (two) times daily. 12/09/23  Yes Bernardino Ditch, NP  pantoprazole (PROTONIX) 20 MG tablet Take 1 tablet (20 mg total) by mouth 2 (two) times daily before meals 11/05/23 11/04/24 Yes [provider]  phenazopyridine (PYRIDIUM) 200 MG tablet Take 1 tablet (200 mg total) by mouth 3 (three) times daily. 12/09/23  Yes Bernardino Ditch, NP  phentermine 15 MG capsule Take 1 capsule (15 mg total) by mouth every morning before breakfast for 30 days 11/28/23 12/28/23 Yes [provider]  acetaminophen  (TYLENOL ) 500 MG tablet Take 500 mg by mouth every 6 (six) hours as needed.    [provider]  albuterol (VENTOLIN HFA) 108 (90 Base) MCG/ACT inhaler  Inhale 1-2 puffs into the lungs every 6 (six) hours as needed for wheezing or shortness of breath.    [provider]  cetirizine (ZYRTEC) 10 MG tablet Take 10 mg by mouth daily. 06/18/21   [provider]  Cholecalciferol (VITAMIN D3) 125 MCG (5000 UT) CAPS Take 1 capsule by mouth daily.    [provider]  cyanocobalamin 1000 MCG tablet Take 1,000 mcg by mouth daily.    [provider]  Digestive Enzymes (SUPER ENZYMES PO) Take 1 capsule by mouth daily.    [provider]  escitalopram (LEXAPRO) 20 MG tablet Take 20 mg by mouth daily.    [provider]  ibuprofen  (ADVIL ) 600 MG tablet Take 1 tablet (600 mg total) by mouth every 6 (six) hours as needed. 08/13/21   Van Knee, MD  MAGNESIUM OXIDE 400 PO Take 1 tablet by mouth daily.    [provider]  methocarbamol  (ROBAXIN ) 500 MG tablet Take 500 mg by mouth 3 (three) times daily.    [provider]  montelukast (SINGULAIR) 10 MG tablet Take 10 mg by mouth at bedtime.    [provider]  OVER THE COUNTER MEDICATION Place 3 drops under the tongue daily. B-complex liquid    [provider]  Probiotic Product (PROBIOTIC-10 PO) Take 1 capsule by mouth daily.    [provider]  topiramate  (TOPAMAX ) 50 MG tablet Take 50 mg by mouth 2 (two) times daily.    [provider]  clonazePAM  (KLONOPIN ) 0.5 MG tablet Take 1 tablet (0.5 mg total) by mouth daily. 10/18/16 04/22/19  Dorothyann Drivers, MD  lamoTRIgine (LAMICTAL) 100 MG tablet Take 100 mg by mouth 2 (two) times daily.  04/22/19  [provider]  norethindrone-ethinyl estradiol (JUNEL FE,GILDESS FE,LOESTRIN FE) 1-20 MG-MCG tablet Take 1 tablet by mouth daily. 03/11/17 04/22/19  [provider]  propranolol ER (INDERAL LA) 60 MG 24 hr capsule Take 60 mg by mouth daily. 11/14/16 04/22/19  [provider]  SUMAtriptan  (IMITREX ) 100 MG tablet Take 1 tablet earliest onset of  migraine.  May repeat once in 2 hours if headache persists or recurs.  Do not exceed 2 tablets in 24 hours. 03/18/17 04/22/19  Skeet Juliene SAUNDERS, DO    Family History Family History  Problem Relation Age of Onset   Heart disease Mother    Diabetes Mother    Breast cancer Mother 41   Kidney failure Father    Gestational diabetes Sister    Heart disease Maternal Grandmother    Diverticulitis Maternal Grandmother    Breast cancer Maternal Grandmother        dx 11s, dx 3 more times    Social History Social History   Tobacco Use  Smoking status: Former    Current packs/day: 0.00    Average packs/day: 0.5 packs/day for 13.0 years (6.5 ttl pk-yrs)    Types: Cigarettes    Start date: 10/19/1998    Quit date: 10/19/2011    Years since quitting: 12.1   Smokeless tobacco: Never  Vaping Use   Vaping status: Never Used  Substance Use Topics   Alcohol use: Not Currently    Comment: rarely- once per year   Drug use: No     Allergies   Gabapentin and Tioconazole   Review of Systems Review of Systems  Constitutional:  Negative for fever.  Gastrointestinal:  Positive for nausea. Negative for abdominal pain and vomiting.  Genitourinary:  Positive for dysuria, frequency and urgency. Negative for hematuria, vaginal discharge and vaginal pain.  Musculoskeletal:  Negative for back pain.     Physical Exam Triage Vital Signs ED Triage Vitals  Encounter Vitals Group     BP      Girls Systolic BP Percentile      Girls Diastolic BP Percentile      Boys Systolic BP Percentile      Boys Diastolic BP Percentile      Pulse      Resp      Temp      Temp src      SpO2      Weight      Height      Head Circumference      Peak Flow      Pain Score      Pain Loc      Pain Education      Exclude from Growth Chart    No data found.  Updated Vital Signs BP 122/86 (BP Location: Left Arm)   Pulse 80   Temp 98.9 F (37.2 C) (Oral)   Resp 16   Ht 5' 6 (1.676 m)   Wt 230 lb (104.3 kg)    LMP 02/06/2022 (Exact Date)   SpO2 98%   BMI 37.12 kg/m   Visual Acuity Right Eye Distance:   Left Eye Distance:   Bilateral Distance:    Right Eye Near:   Left Eye Near:    Bilateral Near:     Physical Exam Vitals and nursing note reviewed.  Constitutional:      Appearance: Normal appearance. She is not ill-appearing.  HENT:     Head: Normocephalic and atraumatic.  Cardiovascular:     Rate and Rhythm: Normal rate and regular rhythm.     Pulses: Normal pulses.     Heart sounds: Normal heart sounds. No murmur heard.    No friction rub. No gallop.  Pulmonary:     Effort: Pulmonary effort is normal.     Breath sounds: Normal breath sounds. No wheezing, rhonchi or rales.  Abdominal:     General: Abdomen is flat.     Palpations: Abdomen is soft.     Tenderness: There is no abdominal tenderness. There is no right CVA tenderness, left CVA tenderness, guarding or rebound.  Skin:    General: Skin is warm and dry.     Capillary Refill: Capillary refill takes less than 2 seconds.     Findings: No rash.  Neurological:     General: No focal deficit present.     Mental Status: She is alert and oriented to person, place, and time.      UC Treatments / Results  Labs (all labs ordered are listed, but only abnormal results  are displayed) Labs Reviewed  URINALYSIS, W/ REFLEX TO CULTURE (INFECTION SUSPECTED) - Abnormal; Notable for the following components:      Result Value   Color, Urine ORANGE (*)    APPearance HAZY (*)    Glucose, UA 100 (*)    Hgb urine dipstick SMALL (*)    Protein, ur 30 (*)    Nitrite POSITIVE (*)    Leukocytes,Ua SMALL (*)    Non Squamous Epithelial PRESENT (*)    Bacteria, UA MANY (*)    All other components within normal limits  URINE CULTURE    EKG   Radiology No results found.  Procedures Procedures (including critical care time)  Medications Ordered in UC Medications - No data to display  Initial Impression / Assessment and Plan /  UC Course  I have reviewed the triage vital signs and the nursing notes.  Pertinent labs & imaging results that were available during my care of the patient were reviewed by me and considered in my medical decision making (see chart for details).   Patient is a pleasant, nontoxic-appearing 38 year old female presenting for evaluation of UTI symptoms outlined HPI above.  She is status post hysterectomy with bilateral salpingectomy in January 2024.  She reports that since her hysterectomy she has had multiple UTIs.  No prior history of UTIs before her hysterectomy.  She continues to have her ovaries.  She reports that she has an upcoming appointment with her PCP and she is going to discuss having blood work to see if she is perimenopausal.  Additionally, she reports that she is having some itching at the urethra but she denies any vaginal itching or discharge.  I will order a urinalysis to assess for the presence of UTI.  Urinalysis has an orange color with a hazy appearance, 100 glucose, small hemoglobin, 30 protein, nitrite positive with small leukocyte esterase.  Reflex microscopy shows non-squamous epithelials present, 11-20 WBCs, many bacteria, with WBC clumps.  Urine will reflex to culture.  Previous urine cultures that showed mixed urogenital flora or insignificant growth.  I will discharge patient on the diagnosis of urinary tract infection and start her on Macrobid 100 mg twice daily for 5 days along with Pyridium 200 mg every 8 hours as needed for urinary discomfort.  Return precautions reviewed.   Final Clinical Impressions(s) / UC Diagnoses   Final diagnoses:  Lower urinary tract infectious disease     Discharge Instructions      Take the Macrobid twice daily for 5 days with food for treatment of urinary tract infection.  Use the Pyridium every 8 hours as needed for urinary discomfort.  This will turn your urine a bright red-orange.  Increase your oral fluid intake so that you  increase your urine production and or flushing your urinary system.  Take an over-the-counter probiotic, such as Culturelle-Align-Activia, 1 hour after each dose of antibiotic to prevent diarrhea or yeast infections from forming.  We will culture urine and change the antibiotics if necessary.  Return for reevaluation, or see your primary care provider, for any new or worsening symptoms.      ED Prescriptions     Medication Sig Dispense Auth. Provider   nitrofurantoin, macrocrystal-monohydrate, (MACROBID) 100 MG capsule Take 1 capsule (100 mg total) by mouth 2 (two) times daily. 10 capsule Bernardino Ditch, NP   phenazopyridine (PYRIDIUM) 200 MG tablet Take 1 tablet (200 mg total) by mouth 3 (three) times daily. 6 tablet Bernardino Ditch, NP  PDMP not reviewed this encounter.   Bernardino Ditch, NP 12/09/23 1233

## 2023-12-09 NOTE — Discharge Instructions (Addendum)

## 2023-12-10 LAB — URINE CULTURE

## 2023-12-11 ENCOUNTER — Ambulatory Visit (HOSPITAL_COMMUNITY): Payer: Self-pay

## 2024-04-30 ENCOUNTER — Other Ambulatory Visit

## 2024-05-05 ENCOUNTER — Ambulatory Visit: Admitting: Oncology
